# Patient Record
Sex: Female | Born: 1975 | Race: Black or African American | Hispanic: No | Marital: Single | Smoking: Current every day smoker
Health system: Southern US, Community
[De-identification: ages and names within clinical notes are randomized; demographics above are authoritative.]

## PROBLEM LIST (undated history)

## (undated) DIAGNOSIS — I10 Essential (primary) hypertension: Secondary | ICD-10-CM

## (undated) DIAGNOSIS — E669 Obesity, unspecified: Secondary | ICD-10-CM

## (undated) DIAGNOSIS — Z789 Other specified health status: Secondary | ICD-10-CM

## (undated) DIAGNOSIS — E119 Type 2 diabetes mellitus without complications: Secondary | ICD-10-CM

## (undated) HISTORY — PX: NO PAST SURGERIES: SHX2092

---

## 1998-04-15 ENCOUNTER — Ambulatory Visit (HOSPITAL_COMMUNITY): Admission: RE | Admit: 1998-04-15 | Discharge: 1998-04-15 | Payer: Self-pay | Admitting: Obstetrics

## 1998-04-15 ENCOUNTER — Other Ambulatory Visit: Admission: RE | Admit: 1998-04-15 | Discharge: 1998-04-15 | Payer: Self-pay | Admitting: Obstetrics

## 1998-06-18 ENCOUNTER — Ambulatory Visit (HOSPITAL_COMMUNITY): Admission: RE | Admit: 1998-06-18 | Discharge: 1998-06-18 | Payer: Self-pay | Admitting: Obstetrics

## 1998-08-28 ENCOUNTER — Inpatient Hospital Stay (HOSPITAL_COMMUNITY): Admission: AD | Admit: 1998-08-28 | Discharge: 1998-08-28 | Payer: Self-pay | Admitting: Obstetrics

## 1998-08-29 ENCOUNTER — Inpatient Hospital Stay (HOSPITAL_COMMUNITY): Admission: AD | Admit: 1998-08-29 | Discharge: 1998-08-31 | Payer: Self-pay | Admitting: Obstetrics

## 2000-01-15 ENCOUNTER — Emergency Department (HOSPITAL_COMMUNITY): Admission: EM | Admit: 2000-01-15 | Discharge: 2000-01-15 | Payer: Self-pay | Admitting: Emergency Medicine

## 2000-01-15 ENCOUNTER — Encounter: Payer: Self-pay | Admitting: Emergency Medicine

## 2001-01-19 ENCOUNTER — Inpatient Hospital Stay (HOSPITAL_COMMUNITY): Admission: AD | Admit: 2001-01-19 | Discharge: 2001-01-19 | Payer: Self-pay | Admitting: Obstetrics & Gynecology

## 2002-02-07 ENCOUNTER — Emergency Department (HOSPITAL_COMMUNITY): Admission: EM | Admit: 2002-02-07 | Discharge: 2002-02-07 | Payer: Self-pay | Admitting: Emergency Medicine

## 2002-03-15 ENCOUNTER — Emergency Department (HOSPITAL_COMMUNITY): Admission: EM | Admit: 2002-03-15 | Discharge: 2002-03-15 | Payer: Self-pay | Admitting: Emergency Medicine

## 2002-03-26 ENCOUNTER — Encounter: Payer: Self-pay | Admitting: Emergency Medicine

## 2002-03-26 ENCOUNTER — Emergency Department (HOSPITAL_COMMUNITY): Admission: EM | Admit: 2002-03-26 | Discharge: 2002-03-26 | Payer: Self-pay | Admitting: *Deleted

## 2002-06-07 ENCOUNTER — Emergency Department (HOSPITAL_COMMUNITY): Admission: EM | Admit: 2002-06-07 | Discharge: 2002-06-07 | Payer: Self-pay | Admitting: Emergency Medicine

## 2002-06-08 ENCOUNTER — Encounter: Payer: Self-pay | Admitting: Emergency Medicine

## 2002-06-08 ENCOUNTER — Emergency Department (HOSPITAL_COMMUNITY): Admission: EM | Admit: 2002-06-08 | Discharge: 2002-06-08 | Payer: Self-pay | Admitting: Emergency Medicine

## 2002-06-09 ENCOUNTER — Emergency Department (HOSPITAL_COMMUNITY): Admission: EM | Admit: 2002-06-09 | Discharge: 2002-06-09 | Payer: Self-pay | Admitting: Emergency Medicine

## 2002-07-14 ENCOUNTER — Emergency Department (HOSPITAL_COMMUNITY): Admission: EM | Admit: 2002-07-14 | Discharge: 2002-07-14 | Payer: Self-pay | Admitting: Emergency Medicine

## 2002-07-18 ENCOUNTER — Emergency Department (HOSPITAL_COMMUNITY): Admission: EM | Admit: 2002-07-18 | Discharge: 2002-07-18 | Payer: Self-pay | Admitting: *Deleted

## 2002-07-20 ENCOUNTER — Encounter: Payer: Self-pay | Admitting: Internal Medicine

## 2002-07-20 ENCOUNTER — Inpatient Hospital Stay (HOSPITAL_COMMUNITY): Admission: EM | Admit: 2002-07-20 | Discharge: 2002-07-22 | Payer: Self-pay | Admitting: Emergency Medicine

## 2002-08-15 ENCOUNTER — Emergency Department (HOSPITAL_COMMUNITY): Admission: EM | Admit: 2002-08-15 | Discharge: 2002-08-15 | Payer: Self-pay | Admitting: Emergency Medicine

## 2003-01-19 ENCOUNTER — Inpatient Hospital Stay (HOSPITAL_COMMUNITY): Admission: AD | Admit: 2003-01-19 | Discharge: 2003-01-19 | Payer: Self-pay | Admitting: *Deleted

## 2003-03-26 ENCOUNTER — Emergency Department (HOSPITAL_COMMUNITY): Admission: EM | Admit: 2003-03-26 | Discharge: 2003-03-26 | Payer: Self-pay | Admitting: Emergency Medicine

## 2003-06-03 ENCOUNTER — Emergency Department (HOSPITAL_COMMUNITY): Admission: EM | Admit: 2003-06-03 | Discharge: 2003-06-03 | Payer: Self-pay | Admitting: Emergency Medicine

## 2003-08-14 ENCOUNTER — Encounter: Payer: Self-pay | Admitting: Internal Medicine

## 2003-08-14 ENCOUNTER — Encounter: Admission: RE | Admit: 2003-08-14 | Discharge: 2003-08-14 | Payer: Self-pay

## 2003-12-18 ENCOUNTER — Inpatient Hospital Stay (HOSPITAL_COMMUNITY): Admission: AD | Admit: 2003-12-18 | Discharge: 2003-12-18 | Payer: Self-pay | Admitting: Obstetrics

## 2004-02-07 ENCOUNTER — Inpatient Hospital Stay (HOSPITAL_COMMUNITY): Admission: AD | Admit: 2004-02-07 | Discharge: 2004-02-07 | Payer: Self-pay | Admitting: Obstetrics

## 2004-02-26 ENCOUNTER — Inpatient Hospital Stay (HOSPITAL_COMMUNITY): Admission: AD | Admit: 2004-02-26 | Discharge: 2004-02-26 | Payer: Self-pay | Admitting: Obstetrics

## 2004-03-31 ENCOUNTER — Inpatient Hospital Stay (HOSPITAL_COMMUNITY): Admission: AD | Admit: 2004-03-31 | Discharge: 2004-04-02 | Payer: Self-pay | Admitting: Obstetrics

## 2005-02-08 ENCOUNTER — Ambulatory Visit: Payer: Self-pay | Admitting: Gastroenterology

## 2005-11-12 ENCOUNTER — Encounter (INDEPENDENT_AMBULATORY_CARE_PROVIDER_SITE_OTHER): Payer: Self-pay | Admitting: *Deleted

## 2005-11-12 ENCOUNTER — Inpatient Hospital Stay (HOSPITAL_COMMUNITY): Admission: AD | Admit: 2005-11-12 | Discharge: 2005-11-14 | Payer: Self-pay | Admitting: Obstetrics

## 2006-10-28 ENCOUNTER — Emergency Department (HOSPITAL_COMMUNITY): Admission: EM | Admit: 2006-10-28 | Discharge: 2006-10-28 | Payer: Self-pay | Admitting: Emergency Medicine

## 2007-04-09 ENCOUNTER — Inpatient Hospital Stay (HOSPITAL_COMMUNITY): Admission: AD | Admit: 2007-04-09 | Discharge: 2007-04-09 | Payer: Self-pay | Admitting: Obstetrics

## 2007-04-20 ENCOUNTER — Emergency Department (HOSPITAL_COMMUNITY): Admission: EM | Admit: 2007-04-20 | Discharge: 2007-04-20 | Payer: Self-pay | Admitting: Emergency Medicine

## 2008-09-25 ENCOUNTER — Emergency Department (HOSPITAL_COMMUNITY): Admission: EM | Admit: 2008-09-25 | Discharge: 2008-09-25 | Payer: Self-pay | Admitting: Family Medicine

## 2008-09-27 ENCOUNTER — Emergency Department (HOSPITAL_COMMUNITY): Admission: EM | Admit: 2008-09-27 | Discharge: 2008-09-27 | Payer: Self-pay | Admitting: Emergency Medicine

## 2008-10-18 ENCOUNTER — Ambulatory Visit: Payer: Self-pay | Admitting: Diagnostic Radiology

## 2008-10-18 ENCOUNTER — Emergency Department (HOSPITAL_BASED_OUTPATIENT_CLINIC_OR_DEPARTMENT_OTHER): Admission: EM | Admit: 2008-10-18 | Discharge: 2008-10-18 | Payer: Self-pay | Admitting: Emergency Medicine

## 2008-10-25 ENCOUNTER — Emergency Department (HOSPITAL_BASED_OUTPATIENT_CLINIC_OR_DEPARTMENT_OTHER): Admission: EM | Admit: 2008-10-25 | Discharge: 2008-10-25 | Payer: Self-pay | Admitting: Emergency Medicine

## 2008-10-31 ENCOUNTER — Emergency Department (HOSPITAL_BASED_OUTPATIENT_CLINIC_OR_DEPARTMENT_OTHER): Admission: EM | Admit: 2008-10-31 | Discharge: 2008-10-31 | Payer: Self-pay | Admitting: Emergency Medicine

## 2009-07-17 ENCOUNTER — Emergency Department (HOSPITAL_BASED_OUTPATIENT_CLINIC_OR_DEPARTMENT_OTHER): Admission: EM | Admit: 2009-07-17 | Discharge: 2009-07-17 | Payer: Self-pay | Admitting: Emergency Medicine

## 2011-04-01 NOTE — Discharge Summary (Signed)
NAMEMarland Kitchen  Shawna, Harris NO.:  [id number]   MEDICAL RECORD NO.:  [id number]                   PATIENT TYPE:  INP   LOCATION:  0482                                 FACILITY:  [facility]   PHYSICIAN:  Lazaro Arms, M.D.        DATE OF BIRTH:  Jan 13, 1976   DATE OF ADMISSION:  07/20/2002  DATE OF DISCHARGE:  07/22/2002                                 DISCHARGE SUMMARY   HISTORY OF PRESENT ILLNESS:  Ms. Shawna Harris is a 35 year old female who was  admitted for cellulitis refractor to oral therapy of her foot. The patient  has presented to the emergency room seven times since June 07, 2002, with  right foot pain which began approximately a week after having a pedicure.  She had pain in her fourth toe with some erythema of that foot. Of note  there was a razor used while cutting her nails, which may have caused some  breach of skin integrity. She has been seen in the emergency room and placed  on several courses of antibiotics which she took faithfully including Keflex  and Augmentin. She had minimal improvement the day prior to admission. She  had increased pain and swelling and the redness started extending up her  foot and she was admitted for cellulitis on July 20, 2002. She is a  gravida 2, para 2. She was noted to have an elevated blood sugar on her last  emergency room visit and was started on Glucophage at that point. Her LMP  was July 04, 2002 but she had an abortion in early August.   ALLERGIES:  No known drug allergies.   SOCIAL HISTORY:  She lives with her boyfriend and two children. She is  currently unemployed. She is a one pack a day smoker. Denies any alcohol or  drug use.   FAMILY HISTORY:  Has extensive diabetes mellitus on both sides.   REVIEW OF SYSTEMS:  She has recently had some weight gain but denies any  constitutional symptoms, fevers, or chills.   PHYSICAL EXAMINATION:  VITAL SIGNS: Afebrile. Pulse 74, respiratory rate  20,  blood pressure 121/70. A CBG was 81.  EXTREMITIES: Initial examination is remarkable for a 2 mm fluctuant mass  into the web space between her fourth and fifth toes. She did have some  erythema extending onto her foot and induration. The area was outlined with  pin initially.   An x-ray of the foot revealed some soft tissue swelling but no bony  abnormality. Initial white count was 10.5. Initial sed rate was 22 and her  CO2 was only slightly elevated at 1.8. BMP was within normal limits.   IMPRESSION:  Infected abscess with a surrounding cellulitis.   PROCEDURE:  The area was prepped and draped in usual sterile fashion and a  #21 gauge needle was introduced in sterile fashion and 3 cc of foul smelling  purulent exudate was expressed which was sent for a gram  stain and culture.  Then a #10 blade small superficial incision was made on the most superficial  epidermal layer, revealing healthy skin underneath. There was no more pus  expressed. The area was then dressed with sterile dressings.   HOSPITAL COURSE:  The patient was admitted and placed on Zosyn 3.375 grams  IV every six hours and given local wound care and blood sugars were  monitored. The patient was placed on a diabetic diet. The patient continued  to do well and remained afebrile with stable vital signs. The foot redness  completely resolved by the next day and her pain in her toe area reduced  down to the point where on the day of discharge, July 22, 2002, she only  had pain with palpation of the area. The wound still looked good. There was  just some superficial are of the incision that was still open. There was no  new exudate. There was no bleeding. And the area had reduced pain. Pulses  were good and sensation was intact.   LABORATORY DATA:  Hemoglobin A1C was 5.8 which was within normal limits. Her  culture came back positive for staph aureus resistant to methicillin.  However, it was extremely sensitive to  clindamycin.   DISCHARGE DIAGNOSES:  1. Toe abscess with methicillin-resistant Staphylococcus aureus, status post     incision and drainage.  2. Cellulitis, now resolved.  3. Elevated blood sugar, now resolved.   DISCHARGE MEDICATIONS:  1. Clindamycin 300 mg p.o. every six hours.  2. Tylenol as needed for pain.   FOLLOW UP:  The patient has an appointment tomorrow with Dr. Concepcion Harris at [address] at 2:30 p.m. for followup and wound check.   INSTRUCTIONS:  The patient was instructed to keep the area clean and dry and  was described extensively how to dress this in sterile fashion. She was  instructed not to swim and to keep the area dry. She will call immediately  if redness, swelling, or pain worsens.                                                Lazaro Arms, M.D.    AMC/MEDQ  D:  07/22/2002  T:  07/22/2002  Job:  [id number]   cc:   Dorma Russell A. Shawna Harris, M.D.

## 2011-08-16 LAB — CULTURE, ROUTINE-ABSCESS

## 2011-08-19 LAB — POCT CARDIAC MARKERS
Myoglobin, poc: 22 ng/mL (ref 12–200)
Troponin i, poc: <0.05 ng/mL (ref 0.00–0.09)

## 2012-08-28 ENCOUNTER — Emergency Department (HOSPITAL_BASED_OUTPATIENT_CLINIC_OR_DEPARTMENT_OTHER)
Admission: EM | Admit: 2012-08-28 | Discharge: 2012-08-28 | Disposition: A | Discharge status: Home / Self Care | Payer: Self-pay | Attending: Emergency Medicine | Admitting: Emergency Medicine

## 2012-08-28 ENCOUNTER — Encounter (HOSPITAL_BASED_OUTPATIENT_CLINIC_OR_DEPARTMENT_OTHER): Payer: Self-pay | Admitting: *Deleted

## 2012-08-28 DIAGNOSIS — K529 Noninfective gastroenteritis and colitis, unspecified: Secondary | ICD-10-CM

## 2012-08-28 DIAGNOSIS — K5289 Other specified noninfective gastroenteritis and colitis: Secondary | ICD-10-CM | POA: Insufficient documentation

## 2012-08-28 DIAGNOSIS — F17200 Nicotine dependence, unspecified, uncomplicated: Secondary | ICD-10-CM | POA: Insufficient documentation

## 2012-08-28 MED ORDER — ONDANSETRON 4 MG PO TBDP: 4.0000 mg | ORAL_TABLET | Freq: Three times a day (TID) | ORAL | Status: DC | PRN

## 2012-08-28 MED ORDER — ONDANSETRON 4 MG PO TBDP
4.0000 mg | ORAL_TABLET | Freq: Once | ORAL | Status: AC
  Administered 2012-08-28: 4 mg via ORAL
  Filled 2012-08-28: qty 1

## 2012-08-28 NOTE — ED Notes (Signed)
Tolerated po ice chips well.

## 2012-08-28 NOTE — ED Notes (Signed)
Ice chips given

## 2012-08-28 NOTE — ED Notes (Signed)
Diarrhea and vomiting since yesterday. States she was a party over the weekend with people recovering from a stomach virus.

## 2012-08-28 NOTE — ED Provider Notes (Signed)
History     CSN: [id number]  Arrival date & time 08/28/12  4098   First MD Initiated Contact with Patient 08/28/12 1853      Chief Complaint  Patient presents with  . Diarrhea    HPI Diarrhea and vomiting since yesterday. States she was a party over the weekend with people recovering from a stomach virus.  History reviewed. No pertinent past medical history.  History reviewed. No pertinent past surgical history.  No family history on file.  History  Substance Use Topics  . Smoking status: Current Every Day Smoker -- 0.5 packs/day    Types: Cigarettes  . Smokeless tobacco: Not on file  . Alcohol Use: Yes    OB History    Grav Para Term Preterm Abortions TAB SAB Ect Mult Living                  Review of Systems  All other systems reviewed and are negative.    Allergies  Review of patient's allergies indicates no known allergies.  Home Medications   Current Outpatient Rx  Name Route Sig Dispense Refill  . ONDANSETRON 4 MG PO TBDP Oral Take 1 tablet (4 mg total) by mouth every 8 (eight) hours as needed for nausea. 20 tablet 0    BP 130/89  Pulse 88  Temp 98.5 F (36.9 C) (Oral)  Resp 20  SpO2 100%  LMP 08/26/2012  Physical Exam  Nursing note and vitals reviewed. Constitutional: She is oriented to person, place, and time. She appears well-developed and well-nourished. No distress.  HENT:  Head: Normocephalic and atraumatic.  Eyes: Pupils are equal, round, and reactive to light.  Neck: Normal range of motion.  Cardiovascular: Normal rate and intact distal pulses.   Pulmonary/Chest: No respiratory distress.  Abdominal: Normal appearance. She exhibits no distension. There is no tenderness. There is no rebound and no guarding.  Musculoskeletal: Normal range of motion.  Neurological: She is alert and oriented to person, place, and time. No cranial nerve deficit.  Skin: Skin is warm and dry. No rash noted.  Psychiatric: She has a normal mood and affect.  Her behavior is normal.    ED Course  Procedures (including critical care time)  Medications  ondansetron (ZOFRAN ODT) 4 MG disintegrating tablet (not administered)  ondansetron (ZOFRAN-ODT) disintegrating tablet 4 mg (4 mg Oral Given 08/28/12 1906)    Labs Reviewed - No data to display No results found.   1. Gastroenteritis       MDM  After treatment in the ED the patient feels back to baseline and wants to go home.         Nelia Shi, MD 08/28/12 2016

## 2012-08-28 NOTE — ED Notes (Signed)
Pt declines iv start and lab draw. Pt states she wants to wait for the doctor to examine her first.

## 2012-08-28 NOTE — ED Notes (Signed)
MD at bedside. 

## 2013-03-02 ENCOUNTER — Encounter (HOSPITAL_BASED_OUTPATIENT_CLINIC_OR_DEPARTMENT_OTHER): Payer: Self-pay | Admitting: *Deleted

## 2013-03-02 ENCOUNTER — Emergency Department (HOSPITAL_BASED_OUTPATIENT_CLINIC_OR_DEPARTMENT_OTHER)
Admission: EM | Admit: 2013-03-02 | Discharge: 2013-03-02 | Disposition: A | Discharge status: Home / Self Care | Payer: BC Managed Care – PPO | Attending: Emergency Medicine | Admitting: Emergency Medicine

## 2013-03-02 DIAGNOSIS — J020 Streptococcal pharyngitis: Secondary | ICD-10-CM

## 2013-03-02 DIAGNOSIS — J309 Allergic rhinitis, unspecified: Secondary | ICD-10-CM | POA: Insufficient documentation

## 2013-03-02 DIAGNOSIS — IMO0001 Reserved for inherently not codable concepts without codable children: Secondary | ICD-10-CM | POA: Insufficient documentation

## 2013-03-02 DIAGNOSIS — F17200 Nicotine dependence, unspecified, uncomplicated: Secondary | ICD-10-CM | POA: Insufficient documentation

## 2013-03-02 DIAGNOSIS — J3489 Other specified disorders of nose and nasal sinuses: Secondary | ICD-10-CM | POA: Insufficient documentation

## 2013-03-02 DIAGNOSIS — J302 Other seasonal allergic rhinitis: Secondary | ICD-10-CM

## 2013-03-02 DIAGNOSIS — Z3202 Encounter for pregnancy test, result negative: Secondary | ICD-10-CM | POA: Insufficient documentation

## 2013-03-02 DIAGNOSIS — J029 Acute pharyngitis, unspecified: Secondary | ICD-10-CM | POA: Insufficient documentation

## 2013-03-02 LAB — PREGNANCY, URINE: Preg Test, Ur: NEGATIVE

## 2013-03-02 MED ORDER — PENICILLIN G BENZATHINE 1200000 UNIT/2ML IM SUSP
1.2000 10*6.[IU] | Freq: Once | INTRAMUSCULAR | Status: AC
  Administered 2013-03-02: 1.2 10*6.[IU] via INTRAMUSCULAR
  Filled 2013-03-02: qty 2

## 2013-03-02 MED ORDER — CETIRIZINE HCL 10 MG PO TABS: 10.0000 mg | ORAL_TABLET | Freq: Every day | ORAL | Status: DC

## 2013-03-02 MED ORDER — DIPHENHYDRAMINE HCL 25 MG PO CAPS
25.0000 mg | ORAL_CAPSULE | Freq: Once | ORAL | Status: AC
  Administered 2013-03-02: 25 mg via ORAL
  Filled 2013-03-02: qty 1

## 2013-03-02 NOTE — ED Provider Notes (Signed)
Medical screening examination/treatment/procedure(s) were performed by non-physician practitioner and as supervising physician I was immediately available for consultation/collaboration.  Ethelda Chick, MD 03/02/13 [phone]

## 2013-03-02 NOTE — ED Notes (Signed)
Pt states she has been sick for about a week with sinus issues, body aches, etc. Sore throat onset Thur. Congested

## 2013-03-02 NOTE — ED Provider Notes (Signed)
History     CSN: [id number]  Arrival date & time 03/02/13  1216   First MD Initiated Contact with Patient 03/02/13 1242      Chief Complaint  Patient presents with  . Sore Throat    (Consider location/radiation/quality/duration/timing/severity/associated sxs/prior treatment) HPI Comments: Patient is a 37 year old female who presents with a 10 day history of sore throat. Patient reports gradual onset and progressively worsening sharp, severe throat pain. The pain is constant and made worse with swallowing. The pain is localized to the patient's throat and equal on both sides. Nothing alleviates the pain. The patient has not tried anything for symptom relief. Patient reports associated cervical adenopathy, myalgias and congestion. Patient denies headache, visual changes, difficulty breathing, chest pain, SOB, abdominal pain, NVD.      History reviewed. No pertinent past medical history.  History reviewed. No pertinent past surgical history.  History reviewed. No pertinent family history.  History  Substance Use Topics  . Smoking status: Current Every Day Smoker -- 0.50 packs/day    Types: Cigarettes  . Smokeless tobacco: Not on file  . Alcohol Use: Yes    OB History   Grav Para Term Preterm Abortions TAB SAB Ect Mult Living                  Review of Systems  HENT: Positive for congestion and sore throat.   Musculoskeletal: Positive for myalgias.  All other systems reviewed and are negative.    Allergies  Review of patient's allergies indicates no known allergies.  Home Medications   Current Outpatient Rx  Name  Route  Sig  Dispense  Refill  . ondansetron (ZOFRAN ODT) 4 MG disintegrating tablet   Oral   Take 1 tablet (4 mg total) by mouth every 8 (eight) hours as needed for nausea.   20 tablet   0     BP 138/91  Pulse 99  Temp(Src) 98 F (36.7 C) (Oral)  Resp 18  Ht 5\' 4"  (1.626 m)  Wt 210 lb (95.255 kg)  BMI 36.03 kg/m2  SpO2 100%  LMP  02/23/2013  Physical Exam  Nursing note and vitals reviewed. Constitutional: She is oriented to person, place, and time. She appears well-developed and well-nourished. No distress.  HENT:  Head: Normocephalic and atraumatic.  Mouth/Throat: No oropharyngeal exudate.  Tonsillar edema and erythema. Posterior pharyngeal erythema. No exudate noted. Mild maxillary sinus tenderness to palpation.   Eyes: Conjunctivae and EOM are normal.  Neck: Normal range of motion.  Cervical adenopathy and tenderness to palpation.   Cardiovascular: Normal rate and regular rhythm.  Exam reveals no gallop and no friction rub.   No murmur heard. Pulmonary/Chest: Effort normal and breath sounds normal. She has no wheezes. She has no rales. She exhibits no tenderness.  Abdominal: Soft. She exhibits no distension. There is no tenderness. There is no rebound and no guarding.  Musculoskeletal: Normal range of motion.  Lymphadenopathy:    She has cervical adenopathy.  Neurological: She is alert and oriented to person, place, and time. Coordination normal.  Speech is goal-oriented. Moves limbs without ataxia.   Skin: Skin is warm and dry.  Psychiatric: She has a normal mood and affect. Her behavior is normal.    ED Course  Procedures (including critical care time)  Labs Reviewed  RAPID STREP SCREEN  PREGNANCY, URINE   No results found.   1. Strep pharyngitis   2. Seasonal allergies       MDM  1:00  PM Rapid strep pending.   2:25 PM Rapid strep negative. I will treat the patient based on clinical impression for strep throat. Patient likely experiencing seasonal allergies. I will prescribe Zyrtec for seasonal allergy symptoms. Patient afebrile with stable vitals at this time. Patient instructed to return with worsening or concerning symptoms.      Emilia Beck, PA-C 03/02/13 1432

## 2013-03-02 NOTE — ED Notes (Signed)
Patient is resting comfortably. 

## 2013-08-08 ENCOUNTER — Ambulatory Visit: Payer: Self-pay | Admitting: Obstetrics

## 2013-11-04 ENCOUNTER — Encounter (HOSPITAL_BASED_OUTPATIENT_CLINIC_OR_DEPARTMENT_OTHER): Payer: Self-pay | Admitting: Emergency Medicine

## 2013-11-04 ENCOUNTER — Emergency Department (HOSPITAL_BASED_OUTPATIENT_CLINIC_OR_DEPARTMENT_OTHER)
Admission: EM | Admit: 2013-11-04 | Discharge: 2013-11-04 | Disposition: A | Discharge status: Home / Self Care | Payer: Medicaid Other | Attending: Emergency Medicine | Admitting: Emergency Medicine

## 2013-11-04 DIAGNOSIS — R000 Tachycardia, unspecified: Secondary | ICD-10-CM | POA: Insufficient documentation

## 2013-11-04 DIAGNOSIS — Z79899 Other long term (current) drug therapy: Secondary | ICD-10-CM | POA: Insufficient documentation

## 2013-11-04 DIAGNOSIS — R55 Syncope and collapse: Secondary | ICD-10-CM

## 2013-11-04 DIAGNOSIS — F17200 Nicotine dependence, unspecified, uncomplicated: Secondary | ICD-10-CM | POA: Insufficient documentation

## 2013-11-04 DIAGNOSIS — Z3202 Encounter for pregnancy test, result negative: Secondary | ICD-10-CM | POA: Insufficient documentation

## 2013-11-04 DIAGNOSIS — E869 Volume depletion, unspecified: Secondary | ICD-10-CM

## 2013-11-04 LAB — URINALYSIS, ROUTINE W REFLEX MICROSCOPIC
Leukocytes, UA: NEGATIVE
Nitrite: NEGATIVE
Protein, ur: 30 mg/dL — AB
Specific Gravity, Urine: 1.026 (ref 1.005–1.030)
pH: 5.5 (ref 5.0–8.0)

## 2013-11-04 LAB — CBC
HCT: 37.5 % (ref 36.0–46.0)
Hemoglobin: 12.6 g/dL (ref 12.0–15.0)
MCH: 29.4 pg (ref 26.0–34.0)
RBC: 4.28 MIL/uL (ref 3.87–5.11)
RDW: 13.4 % (ref 11.5–15.5)

## 2013-11-04 LAB — URINE MICROSCOPIC-ADD ON

## 2013-11-04 MED ORDER — SODIUM CHLORIDE 0.9 % IV BOLUS (SEPSIS)
1000.0000 mL | Freq: Once | INTRAVENOUS | Status: AC
  Administered 2013-11-04: 1000 mL via INTRAVENOUS

## 2013-11-04 NOTE — ED Provider Notes (Signed)
CSN: [id number]     Arrival date & time 11/04/13  0308 History   First MD Initiated Contact with Patient 11/04/13 0326     Chief Complaint  Patient presents with  . Syncope    (Consider location/radiation/quality/duration/timing/severity/associated sxs/prior Treatment) HPI This is a 37 year old female who went to donate plasma yesterday. Due to loss of IV access they were unable to return the cellular blood components. This morning, just prior to arrival she got up to go to the bathroom, felt lightheaded and had a syncopal episode. She continues to feel lightheaded, worse with standing. She denies nausea or vomiting. She is not short of breath. She was noted to be tachycardic on arrival with positive orthostatic vital signs. She has had vaginal bleeding for the past month.  History reviewed. No pertinent past medical history. History reviewed. No pertinent past surgical history. History reviewed. No pertinent family history. History  Substance Use Topics  . Smoking status: Current Every Day Smoker -- 0.50 packs/day    Types: Cigarettes  . Smokeless tobacco: Not on file  . Alcohol Use: Yes     Comment: socially - 10/31/13   OB History   Grav Para Term Preterm Abortions TAB SAB Ect Mult Living                 Review of Systems  All other systems reviewed and are negative.    Allergies  Review of patient's allergies indicates no known allergies.  Home Medications   Current Outpatient Rx  Name  Route  Sig  Dispense  Refill  . cetirizine (ZYRTEC ALLERGY) 10 MG tablet   Oral   Take 1 tablet (10 mg total) by mouth daily.   30 tablet   1   . ondansetron (ZOFRAN ODT) 4 MG disintegrating tablet   Oral   Take 1 tablet (4 mg total) by mouth every 8 (eight) hours as needed for nausea.   20 tablet   0    BP 114/57  Pulse 130  Temp(Src) 98 F (36.7 C) (Oral)  Resp 18  Ht 5\' 5"  (1.651 m)  Wt 215 lb (97.523 kg)  BMI 35.78 kg/m2  SpO2 99%  LMP 11/04/2013  Physical  Exam General: Well-developed, well-nourished female in no acute distress; appearance consistent with age of record HENT: normocephalic; atraumatic Eyes: pupils equal, round and reactive to light; extraocular muscles intact Neck: supple Heart: regular rate and rhythm; tachycardic Lungs: clear to auscultation bilaterally Abdomen: soft; nondistended; nontender; no masses or hepatosplenomegaly; bowel sounds present Extremities: No deformity; full range of motion; pulses normal; no edema Neurologic: Awake, alert and oriented; motor function intact in all extremities and symmetric; no facial droop Skin: Warm and dry Psychiatric: Normal mood and affect    ED Course  Procedures (including critical care time)  MDM   Nursing notes and vitals signs, including pulse oximetry, reviewed.  Summary of this visit's results, reviewed by myself:  Labs:  Results for orders placed during the hospital encounter of 11/04/13 (from the past 24 hour(s))  CBC     Status: Abnormal   Collection Time    11/04/13  3:35 AM      Result Value Range   WBC 12.3 (*) 4.0 - 10.5 K/uL   RBC 4.28  3.87 - 5.11 MIL/uL   Hemoglobin 12.6  12.0 - 15.0 g/dL   HCT 78.2  95.6 - 21.3 %   MCV 87.6  78.0 - 100.0 fL   MCH 29.4  26.0 - 34.0 pg  MCHC 33.6  30.0 - 36.0 g/dL   RDW 16.1  09.6 - 04.5 %   Platelets 302  150 - 400 K/uL  URINALYSIS, ROUTINE W REFLEX MICROSCOPIC     Status: Abnormal   Collection Time    11/04/13  4:46 AM      Result Value Range   Color, Urine YELLOW  YELLOW   APPearance CLOUDY (*) CLEAR   Specific Gravity, Urine 1.026  1.005 - 1.030   pH 5.5  5.0 - 8.0   Glucose, UA 100 (*) NEGATIVE mg/dL   Hgb urine dipstick LARGE (*) NEGATIVE   Bilirubin Urine SMALL (*) NEGATIVE   Ketones, ur 15 (*) NEGATIVE mg/dL   Protein, ur 30 (*) NEGATIVE mg/dL   Urobilinogen, UA 0.2  0.0 - 1.0 mg/dL   Nitrite NEGATIVE  NEGATIVE   Leukocytes, UA NEGATIVE  NEGATIVE  PREGNANCY, URINE     Status: None   Collection  Time    11/04/13  4:46 AM      Result Value Range   Preg Test, Ur NEGATIVE  NEGATIVE  URINE MICROSCOPIC-ADD ON     Status: Abnormal   Collection Time    11/04/13  4:46 AM      Result Value Range   Squamous Epithelial / LPF FEW (*) RARE   WBC, UA 0-2  <3 WBC/hpf   RBC / HPF 7-10  <3 RBC/hpf   Bacteria, UA FEW (*) RARE   Casts HYALINE CASTS (*) NEGATIVE   Urine-Other MUCOUS PRESENT     5:18 AM Symptoms improved after IV fluid bolus. She has a PCP with whom she will follow up.  Hanley Seamen, MD 11/04/13 [phone]

## 2013-11-04 NOTE — ED Notes (Signed)
Pt states she was up to the bathroom 30 minutes ago, felt lightheaded, had syncopal episode - states she gave plasma today and they were unable to do the "return" process.

## 2014-01-08 ENCOUNTER — Encounter (HOSPITAL_COMMUNITY): Payer: Self-pay | Admitting: Emergency Medicine

## 2014-01-08 ENCOUNTER — Emergency Department (HOSPITAL_COMMUNITY)
Admission: EM | Admit: 2014-01-08 | Discharge: 2014-01-08 | Disposition: A | Discharge status: Home / Self Care | Payer: Medicaid Other | Source: Home / Self Care | Attending: Family Medicine | Admitting: Family Medicine

## 2014-01-08 DIAGNOSIS — R519 Headache, unspecified: Secondary | ICD-10-CM

## 2014-01-08 DIAGNOSIS — R51 Headache: Secondary | ICD-10-CM

## 2014-01-08 MED ORDER — KETOROLAC TROMETHAMINE 30 MG/ML IJ SOLN
30.0000 mg | Freq: Once | INTRAMUSCULAR | Status: AC
  Administered 2014-01-08: 30 mg via INTRAMUSCULAR

## 2014-01-08 MED ORDER — ONDANSETRON 4 MG PO TBDP
ORAL_TABLET | ORAL | Status: AC
Start: 2014-01-08 — End: 2014-01-08
  Filled 2014-01-08: qty 2

## 2014-01-08 MED ORDER — DIPHENHYDRAMINE HCL 50 MG/ML IJ SOLN
INTRAMUSCULAR | Status: AC
  Filled 2014-01-08: qty 1

## 2014-01-08 MED ORDER — DIPHENHYDRAMINE HCL 50 MG/ML IJ SOLN
25.0000 mg | Freq: Once | INTRAMUSCULAR | Status: AC
  Administered 2014-01-08: 25 mg via INTRAMUSCULAR

## 2014-01-08 MED ORDER — KETOROLAC TROMETHAMINE 30 MG/ML IJ SOLN
INTRAMUSCULAR | Status: AC
  Filled 2014-01-08: qty 1

## 2014-01-08 MED ORDER — ONDANSETRON 4 MG PO TBDP
8.0000 mg | ORAL_TABLET | Freq: Once | ORAL | Status: AC
  Administered 2014-01-08: 8 mg via ORAL

## 2014-01-08 NOTE — ED Provider Notes (Signed)
CSN: [id number]     Arrival date & time 01/08/14  1249 History   First MD Initiated Contact with Patient 01/08/14 1436     Chief Complaint  Patient presents with  . Headache     (Consider location/radiation/quality/duration/timing/severity/associated sxs/prior Treatment) HPI Comments: Patient reports headache began yesterday. Pain has been migratory since it began, occuring at different areas on her head at different times. Has waxed and waned with some relief with ibuprofen. Endorses some mild nausea and photophobia, but no phonophobia. Denis recent illness or injury. Denies hx of headaches. Denies fever. Denies changes in speech, vision or balance. No vomiting or diarrhea. No URI sx. No rash. No polyuria or polydipsia.  The history is provided by the patient.    History reviewed. No pertinent past medical history. History reviewed. No pertinent past surgical history. History reviewed. No pertinent family history. History  Substance Use Topics  . Smoking status: Current Every Day Smoker -- 0.50 packs/day    Types: Cigarettes  . Smokeless tobacco: Not on file  . Alcohol Use: Yes     Comment: socially - 10/31/13   OB History   Grav Para Term Preterm Abortions TAB SAB Ect Mult Living                 Review of Systems  All other systems reviewed and are negative.      Allergies  Review of patient's allergies indicates no known allergies.  Home Medications   Current Outpatient Rx  Name  Route  Sig  Dispense  Refill  . cetirizine (ZYRTEC ALLERGY) 10 MG tablet   Oral   Take 1 tablet (10 mg total) by mouth daily.   30 tablet   1   . ondansetron (ZOFRAN ODT) 4 MG disintegrating tablet   Oral   Take 1 tablet (4 mg total) by mouth every 8 (eight) hours as needed for nausea.   20 tablet   0    BP 107/74  Pulse 94  Temp(Src) 98 F (36.7 C) (Oral)  Resp 17  SpO2 99% Physical Exam  Nursing note and vitals reviewed. Constitutional: She is oriented to person, place,  and time. She appears well-developed and well-nourished. No distress.  HENT:  Head: Normocephalic and atraumatic.  Right Ear: Hearing, tympanic membrane, external ear and ear canal normal.  Left Ear: Hearing, tympanic membrane, external ear and ear canal normal.  Nose: Nose normal.  Mouth/Throat: Uvula is midline, oropharynx is clear and moist and mucous membranes are normal.  Eyes: Conjunctivae, EOM and lids are normal. Pupils are equal, round, and reactive to light. Right eye exhibits no nystagmus. Left eye exhibits no nystagmus.  Undilated fundoscopic exam grossly normal.   Neck: Trachea normal, normal range of motion, full passive range of motion without pain and phonation normal. Neck supple. No mass and no thyromegaly present.  Cardiovascular: Normal rate, regular rhythm and normal heart sounds.   Pulmonary/Chest: Effort normal and breath sounds normal.  Abdominal: Soft. Bowel sounds are normal. She exhibits no distension. There is no tenderness.  Musculoskeletal: Normal range of motion.  Neurological: She is alert and oriented to person, place, and time. She has normal strength. No cranial nerve deficit or sensory deficit. Coordination and gait normal. GCS eye subscore is 4. GCS verbal subscore is 5. GCS motor subscore is 6.  Skin: Skin is warm and dry.  Psychiatric: She has a normal mood and affect. Her behavior is normal.    ED Course  Procedures (including critical care  time) Labs Review Labs Reviewed - No data to display Imaging Review No results found.    MDM   Final diagnoses:  Headache  Non-specific headache: exam without worrisome neurological finding. Patient reports near resolution [facility] Use either tylenol or ibuprofen if headache reoccurs and zofran as directed for nausea. If symptoms become suddenly worse, severe or persistent, advised to seek re-evaluation at  nearest ER. If headache with associated changes in speech,  vision, balance, strength or sensation, seek re-evaluation at  nearest ER.      Jess BartersJennifer Lee [place] Heights[place], [place] 01/08/14 [phone]

## 2014-01-08 NOTE — ED Notes (Signed)
Patient c/o HA since this AM. NAD, c/o photophobia

## 2014-01-08 NOTE — Discharge Instructions (Signed)
Please make sure your get plenty of rest and stay well hydrated. Use either tylenol or ibuprofen if headache reoccurs and zofran as directed for nausea. If symptoms become suddenly worse, severe or persistent, please have yourself re-evaluated at your nearest ER. If you develop a headache with associated changes in speech, vision, balance, strength or sensation, seek re-evaluation at your nearest ER.

## 2014-01-10 NOTE — ED Provider Notes (Signed)
Medical screening examination/treatment/procedure(s) were performed by a resident physician or non-physician practitioner and as the supervising physician I was immediately available for consultation/collaboration.  Shade Kaley, MD    Toshi Ishii S Tiziana Cislo, MD 01/10/14 0739 

## 2014-02-26 ENCOUNTER — Encounter (HOSPITAL_COMMUNITY): Payer: Self-pay | Admitting: *Deleted

## 2014-02-26 ENCOUNTER — Inpatient Hospital Stay (HOSPITAL_COMMUNITY): Payer: Medicaid Other

## 2014-02-26 ENCOUNTER — Inpatient Hospital Stay (HOSPITAL_COMMUNITY)
Admission: AD | Admit: 2014-02-26 | Discharge: 2014-02-26 | Disposition: A | Discharge status: Home / Self Care | Payer: Medicaid Other | Source: Ambulatory Visit | Attending: Obstetrics & Gynecology | Admitting: Obstetrics & Gynecology

## 2014-02-26 DIAGNOSIS — N949 Unspecified condition associated with female genital organs and menstrual cycle: Secondary | ICD-10-CM | POA: Insufficient documentation

## 2014-02-26 DIAGNOSIS — R109 Unspecified abdominal pain: Secondary | ICD-10-CM | POA: Insufficient documentation

## 2014-02-26 DIAGNOSIS — IMO0001 Reserved for inherently not codable concepts without codable children: Secondary | ICD-10-CM

## 2014-02-26 DIAGNOSIS — N925 Other specified irregular menstruation: Secondary | ICD-10-CM

## 2014-02-26 DIAGNOSIS — N920 Excessive and frequent menstruation with regular cycle: Secondary | ICD-10-CM | POA: Insufficient documentation

## 2014-02-26 DIAGNOSIS — N938 Other specified abnormal uterine and vaginal bleeding: Secondary | ICD-10-CM

## 2014-02-26 DIAGNOSIS — F17200 Nicotine dependence, unspecified, uncomplicated: Secondary | ICD-10-CM

## 2014-02-26 DIAGNOSIS — E669 Obesity, unspecified: Secondary | ICD-10-CM

## 2014-02-26 DIAGNOSIS — R030 Elevated blood-pressure reading, without diagnosis of hypertension: Secondary | ICD-10-CM | POA: Insufficient documentation

## 2014-02-26 HISTORY — DX: Other specified health status: Z78.9

## 2014-02-26 LAB — CBC
HCT: 39 % (ref 36.0–46.0)
Hemoglobin: 13.3 g/dL (ref 12.0–15.0)
MCH: 29.5 pg (ref 26.0–34.0)
MCHC: 34.1 g/dL (ref 30.0–36.0)
MCV: 86.5 fL (ref 78.0–100.0)
Platelets: 328 10*3/uL (ref 150–400)
RBC: 4.51 MIL/uL (ref 3.87–5.11)
RDW: 13.4 % (ref 11.5–15.5)
WBC: 11.2 10*3/uL — AB (ref 4.0–10.5)

## 2014-02-26 LAB — WET PREP, GENITAL
Clue Cells Wet Prep HPF POC: NONE SEEN
TRICH WET PREP: NONE SEEN
YEAST WET PREP: NONE SEEN

## 2014-02-26 LAB — URINALYSIS, ROUTINE W REFLEX MICROSCOPIC
Bilirubin Urine: NEGATIVE
Glucose, UA: NEGATIVE mg/dL
KETONES UR: NEGATIVE mg/dL
LEUKOCYTES UA: NEGATIVE
NITRITE: NEGATIVE
PROTEIN: NEGATIVE mg/dL
Specific Gravity, Urine: 1.025 (ref 1.005–1.030)
Urobilinogen, UA: 2 mg/dL — ABNORMAL HIGH (ref 0.0–1.0)
pH: 6.5 (ref 5.0–8.0)

## 2014-02-26 LAB — URINE MICROSCOPIC-ADD ON

## 2014-02-26 MED ORDER — MEGESTROL ACETATE 20 MG PO TABS: 20.0000 mg | ORAL_TABLET | Freq: Every day | ORAL | Status: DC

## 2014-02-26 NOTE — MAU Provider Note (Signed)
History     CSN: [id number]  Arrival date and time: 02/26/14 1516   First Provider Initiated Contact with Patient 02/26/14 1543      Chief Complaint  Patient presents with  . Vaginal Bleeding   Vaginal Bleeding Associated symptoms include abdominal pain (pelvic pain x 2 days). Pertinent negatives include no constipation, diarrhea, dysuria, fever, flank pain, frequency, headaches, nausea, rash, sore throat, urgency or vomiting.    Shawna Harris is a [age] y.o. [id number] with a longstanding history of menorrhagia and prolonged bleeding presenting for a 4-5 week history of heavy bleeding. Shawna Harris reports that her cycles have been irregular for years lasting anywhere from 1 week to 3 weeks with 2 weeks to 3 months in between cycles. Her bleeding has always been heavy but in the last 2 days the patient reports needing to use tampons, 2 pads at a time changing them once every 2-4 hours. In the past she has had similar prolonged and bleeding episodes leading her to go to the ED. On those occasions, she reports being prescribed oral contraception for a week which stops her bleeding. However, she has not been willing to take oral contraception consistently due to her heavy smoking (10 cigarettes per day). Of note, the patient admits a family hx of fibroids. Shawna Harris also reports feeling pelvic pain yesterday and today. The pain is constant and dull rated 4/10 currently but has been as high as 8/10 (yesterday). She took 2 Advil yesterday for relief. However, her bleeding has persisted and she is worried she has fibroids. She does admit pain with intercourse and has previously been treated for BV. She denies any vaginal pain or discharge, urinary changes, fevers, N/V, changes in BM, diarrhea. Complete ROS is below. Shawna Harris also has not used any barriers during intercourse; however, she is not attempting pregnancy.  OB History   Grav Para Term Preterm Abortions TAB SAB Ect Mult Living   5 4 4  1 1     4       Past Medical History  Diagnosis Date  . Medical history non-contributory     Past Surgical History  Procedure Laterality Date  . No past surgeries      History reviewed. No pertinent family history.  History  Substance Use Topics  . Smoking status: Current Every Day Smoker -- 0.50 packs/day    Types: Cigarettes  . Smokeless tobacco: Not on file  . Alcohol Use: Yes     Comment: socially     Allergies: No Known Allergies  Prescriptions prior to admission  Medication Sig Dispense Refill  . cetirizine (ZYRTEC ALLERGY) 10 MG tablet Take 1 tablet (10 mg total) by mouth daily.  30 tablet  1  . ondansetron (ZOFRAN ODT) 4 MG disintegrating tablet Take 1 tablet (4 mg total) by mouth every 8 (eight) hours as needed for nausea.  20 tablet  0    Review of Systems  Constitutional: Negative for fever.  HENT: Negative for hearing loss and sore throat.   Eyes: Negative for blurred vision and double vision.  Respiratory: Negative for cough and shortness of breath.   Cardiovascular: Negative for chest pain and leg swelling.  Gastrointestinal: Positive for abdominal pain (pelvic pain x 2 days). Negative for nausea, vomiting, diarrhea, constipation and blood in stool.  Genitourinary: Positive for vaginal bleeding. Negative for dysuria, urgency, frequency and flank pain.  Musculoskeletal: Negative for myalgias.  Skin: Negative for rash.  Neurological: Positive for dizziness (x 2 days). Negative  for weakness and headaches.   Physical Exam   Blood pressure 150/89, pulse 103, temperature 98 F (36.7 C), temperature source Oral, resp. rate 20, last menstrual period 01/16/2014, SpO2 100.00%.  Physical Exam  Constitutional: She is oriented to person, place, and time. She appears well-developed and well-nourished. No distress.  HENT:  Head: Normocephalic and atraumatic.  Eyes: EOM are normal. Pupils are equal, round, and reactive to light. No scleral icterus.  Neck: Normal range  of motion. No tracheal deviation present.  Cardiovascular: Regular rhythm, normal heart sounds and intact distal pulses.  Exam reveals no gallop and no friction rub.   No murmur heard. Tachycardic  Respiratory: Effort normal and breath sounds normal. No stridor. No respiratory distress. She has no wheezes. She has no rales.  GI: Soft. Bowel sounds are normal. She exhibits no distension. There is no tenderness.  Genitourinary: There is no rash, tenderness or lesion on the right labia. There is no rash, tenderness or lesion on the left labia. Uterus is not deviated and not tender. Cervix exhibits no motion tenderness, no discharge and no friability. Right adnexum displays no tenderness. Left adnexum displays no tenderness. There is bleeding (bleeding noted externally) around the vagina. No erythema or tenderness around the vagina. No foreign body around the vagina. No signs of injury around the vagina. No vaginal discharge found.  Os is closed, bleeding noted but not actively hemorrhaging. Bimanual exam difficult to perform d/t obesity.  Musculoskeletal: Normal range of motion. She exhibits no edema and no tenderness.  Neurological: She is alert and oriented to person, place, and time.  Skin: Skin is warm and dry. She is not diaphoretic.  Psychiatric: She has a normal mood and affect.    MAU Course  Procedures  Results for orders placed during the hospital encounter of 02/26/14 (from the past 24 hour(s))  CBC     Status: Abnormal   Collection Time    02/26/14  3:55 PM      Result Value Ref Range   WBC 11.2 (*) 4.0 - 10.5 K/uL   RBC 4.51  3.87 - 5.11 MIL/uL   Hemoglobin 13.3  12.0 - 15.0 g/dL   HCT 09.839.0  11.936.0 - 14.746.0 %   MCV 86.5  78.0 - 100.0 fL   MCH 29.5  26.0 - 34.0 pg   MCHC 34.1  30.0 - 36.0 g/dL   RDW 82.913.4  56.211.5 - 13.015.5 %   Platelets 328  150 - 400 K/uL    No results found.  MDM Shawna Harris is a 38 y.o. [id number] with a 4-5 week history of heavy bleeding. Given her longstanding  history of menorrhagia, prolonged bleeding and family hx, fibroids are most likely diagnosis. Must rule out pregnancy, consider endometriosis, STI.  Assessment and Plan   A: Menorrhagia, prolonged vaginal bleeding  P: CBC, pelvic, UPT, transvag US, GC Chlamydia, wet mount  Update:  Wallis BambergMario Mani 02/26/2014, 4:06 PM   S: States PCP [facility] normal O: Bimanual: no CMT, no obvious abnormality but exam limited by body habitus Addendum   [place] Transvaginal Non-ob  02/26/2014   CLINICAL DATA:  Dysfunctional uterine bleeding.  EXAM: TRANSABDOMINAL AND TRANSVAGINAL ULTRASOUND OF PELVIS  TECHNIQUE: Both transabdominal and transvaginal ultrasound examinations of the pelvis were performed. Transabdominal technique was performed for global imaging of the pelvis including uterus, ovaries, adnexal regions, and pelvic cul-de-sac. It was necessary to proceed with endovaginal exam following the transabdominal exam to visualize the endometrium and adnexal structures.  COMPARISON:  None  FINDINGS: Uterus  Measurements: 10.5 x 4.8 x 6.2 cm. No fibroids or other mass visualized.  Endometrium  Thickness: 6 mm.  No focal abnormality visualized.  Right ovary  Measurements: 3.3 x 2.7 x 2.5 cm. Normal appearance/no adnexal mass.  Left ovary  Measurements: 2.6 x 1.8 x 1.6 cm. Normal appearance/no adnexal mass.  Other findings  No free fluid.  IMPRESSION: 1. The uterus exhibits no acute abnormalities. The endometrial stripe is not abnormally thickened. No abnormal endometrial fluid collections are demonstrated. 2. The ovaries are normal in appearance. No adnexal masses are demonstrated. 3. If bleeding remains unresponsive to hormonal or medical therapy, sonohysterogram should be considered for focal lesion work-up. (Ref: Radiological Reasoning: Algorithmic Workup of Abnormal Vaginal Bleeding with Endovaginal Sonography and Sonohysterography. AJR 2008; 119:J47-82)   Electronically Signed   By: David  [place]   On:  02/26/2014 17:10   US Pelvis Complete  02/26/2014   CLINICAL DATA:  Dysfunctional uterine bleeding.  EXAM: TRANSABDOMINAL AND TRANSVAGINAL ULTRASOUND OF PELVIS  TECHNIQUE: Both transabdominal and transvaginal ultrasound examinations of the pelvis were performed. Transabdominal technique was performed for global imaging of the pelvis including uterus, ovaries, adnexal regions, and pelvic cul-de-sac. It was necessary to proceed with endovaginal exam following the transabdominal exam to visualize the endometrium and adnexal structures.  COMPARISON:  None  FINDINGS: Uterus  Measurements: 10.5 x 4.8 x 6.2 cm. No fibroids or other mass visualized.  Endometrium  Thickness: 6 mm.  No focal abnormality visualized.  Right ovary  Measurements: 3.3 x 2.7 x 2.5 cm. Normal appearance/no adnexal mass.  Left ovary  Measurements: 2.6 x 1.8 x 1.6 cm. Normal appearance/no adnexal mass.  Other findings  No free fluid.  IMPRESSION: 1. The uterus exhibits no acute abnormalities. The endometrial stripe is not abnormally thickened. No abnormal endometrial fluid collections are demonstrated. 2. The ovaries are normal in appearance. No adnexal masses are demonstrated. 3. If bleeding remains unresponsive to hormonal or medical therapy, sonohysterogram should be considered for focal lesion work-up. (Ref: Radiological Reasoning: Algorithmic Workup of Abnormal Vaginal Bleeding with Endovaginal Sonography and Sonohysterography. AJR 2008; 956:O13-08)   Electronically Signed   By: David  [place]   On: 02/26/2014 17:10   Results for orders placed during the hospital encounter of 02/26/14 (from the past 24 hour(s))  CBC     Status: Abnormal   Collection Time    02/26/14  3:55 PM      Result Value Ref Range   WBC 11.2 (*) 4.0 - 10.5 K/uL   RBC 4.51  3.87 - 5.11 MIL/uL   Hemoglobin 13.3  12.0 - 15.0 g/dL   HCT 65.7  84.6 - 96.2 %   MCV 86.5  78.0 - 100.0 fL   MCH 29.5  26.0 - 34.0 pg   MCHC 34.1  30.0 - 36.0 g/dL   RDW 95.2  84.1 - 32.4  %   Platelets 328  150 - 400 K/uL  WET PREP, GENITAL     Status: Abnormal   Collection Time    02/26/14  4:05 PM      Result Value Ref Range   Yeast Wet Prep HPF POC NONE SEEN  NONE SEEN   Trich, Wet Prep NONE SEEN  NONE SEEN   Clue Cells Wet Prep HPF POC NONE SEEN  NONE SEEN   WBC, Wet Prep HPF POC FEW (*) NONE SEEN  UPT negative  1. DUB (dysfunctional uterine bleeding)   2. Elevated BP  3. Obesity   4. Smoker    Evaluation and management procedures were performed by PA-S under my supervision/collaboration. Chart reviewed, patient examined by me and I agree with management and plan.  Discussed normal findings with Dr. Despina Hidden.    Medication List    STOP taking these medications       ibuprofen 200 MG tablet  Commonly known as:  ADVIL,MOTRIN      TAKE these medications       megestrol 20 MG tablet  Commonly known as:  MEGACE  Take 1 tablet (20 mg total) by mouth daily.       Follow-up Information   Follow up with [facility]. (Someone from Clinic will call you with appt.)    Contact information:   [address] [place] [address] [phone]     Danae Orleans, CNM 02/26/2014 6:03 PM

## 2014-02-26 NOTE — Discharge Instructions (Signed)
Abnormal Uterine Bleeding Abnormal uterine bleeding can affect women at various stages in life, including teenagers, women in their reproductive years, pregnant women, and women who have reached menopause. Several kinds of uterine bleeding are considered abnormal, including:  Bleeding or spotting between periods.   Bleeding after sexual intercourse.   Bleeding that is heavier or more than normal.   Periods that last longer than usual.  Bleeding after menopause.  Many cases of abnormal uterine bleeding are minor and simple to treat, while others are more serious. Any type of abnormal bleeding should be evaluated by your health care provider. Treatment will depend on the cause of the bleeding. HOME CARE INSTRUCTIONS Monitor your condition for any changes. The following actions may help to alleviate any discomfort you are experiencing:  Avoid the use of tampons and douches as directed by your health care provider.  Change your pads frequently. You should get regular pelvic exams and Pap tests. Keep all follow-up appointments for diagnostic tests as directed by your health care provider.  SEEK MEDICAL CARE IF:   Your bleeding lasts more than 1 week.   You feel dizzy at times.  SEEK IMMEDIATE MEDICAL CARE IF:   You pass out.   You are changing pads every 15 to 30 minutes.   You have abdominal pain.  You have a fever.   You become sweaty or weak.   You are passing large blood clots from the vagina.   You start to feel nauseous and vomit. MAKE SURE YOU:   Understand these instructions.  Will watch your condition.  Will get help right away if you are not doing well or get worse. Document Released: 10/31/2005 Document Revised: 07/03/2013 Document Reviewed: 05/30/2013 [facility] Patient Information 2014 [employer], [place].  Keep bleeding diary

## 2014-02-26 NOTE — MAU Note (Signed)
Bleeding started 4-5 wks ago;  Has been heavy (changing every 3-4 hrs) yesterday got worse, today was soaking through double pads and tampons.  Passing clots.  Hx of prolonged cycles

## 2014-02-27 LAB — GC/CHLAMYDIA PROBE AMP
CT PROBE, AMP APTIMA: NEGATIVE
GC PROBE AMP APTIMA: NEGATIVE

## 2014-03-11 ENCOUNTER — Encounter: Payer: Self-pay | Admitting: Medical

## 2014-05-09 ENCOUNTER — Encounter: Payer: Medicaid Other | Admitting: Medical

## 2014-06-06 ENCOUNTER — Encounter: Payer: Self-pay | Admitting: Obstetrics & Gynecology

## 2014-06-06 ENCOUNTER — Encounter: Payer: Medicaid Other | Admitting: Obstetrics & Gynecology

## 2014-08-26 ENCOUNTER — Emergency Department (HOSPITAL_BASED_OUTPATIENT_CLINIC_OR_DEPARTMENT_OTHER): Payer: No Typology Code available for payment source

## 2014-08-26 ENCOUNTER — Encounter (HOSPITAL_BASED_OUTPATIENT_CLINIC_OR_DEPARTMENT_OTHER): Payer: Self-pay | Admitting: Emergency Medicine

## 2014-08-26 ENCOUNTER — Emergency Department (HOSPITAL_BASED_OUTPATIENT_CLINIC_OR_DEPARTMENT_OTHER)
Admission: EM | Admit: 2014-08-26 | Discharge: 2014-08-26 | Disposition: A | Discharge status: Home / Self Care | Payer: No Typology Code available for payment source | Attending: Emergency Medicine | Admitting: Emergency Medicine

## 2014-08-26 DIAGNOSIS — S4992XA Unspecified injury of left shoulder and upper arm, initial encounter: Secondary | ICD-10-CM | POA: Insufficient documentation

## 2014-08-26 DIAGNOSIS — Z720 Tobacco use: Secondary | ICD-10-CM | POA: Diagnosis not present

## 2014-08-26 DIAGNOSIS — M25512 Pain in left shoulder: Secondary | ICD-10-CM

## 2014-08-26 DIAGNOSIS — Y9389 Activity, other specified: Secondary | ICD-10-CM | POA: Diagnosis not present

## 2014-08-26 DIAGNOSIS — S299XXA Unspecified injury of thorax, initial encounter: Secondary | ICD-10-CM | POA: Diagnosis present

## 2014-08-26 DIAGNOSIS — M546 Pain in thoracic spine: Secondary | ICD-10-CM

## 2014-08-26 DIAGNOSIS — Y92524 Gas station as the place of occurrence of the external cause: Secondary | ICD-10-CM | POA: Diagnosis not present

## 2014-08-26 MED ORDER — IBUPROFEN 800 MG PO TABS: 800.0000 mg | ORAL_TABLET | Freq: Three times a day (TID) | ORAL | Status: DC

## 2014-08-26 MED ORDER — IBUPROFEN 800 MG PO TABS
800.0000 mg | ORAL_TABLET | Freq: Once | ORAL | Status: AC
  Administered 2014-08-26: 800 mg via ORAL
  Filled 2014-08-26: qty 1
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
## 2014-08-26 NOTE — Discharge Instructions (Signed)

## 2014-09-15 ENCOUNTER — Encounter (HOSPITAL_BASED_OUTPATIENT_CLINIC_OR_DEPARTMENT_OTHER): Payer: Self-pay | Admitting: Emergency Medicine

## 2015-03-15 ENCOUNTER — Inpatient Hospital Stay (HOSPITAL_COMMUNITY): Payer: Medicaid Other

## 2015-03-15 ENCOUNTER — Inpatient Hospital Stay (HOSPITAL_COMMUNITY)
Admission: AD | Admit: 2015-03-15 | Discharge: 2015-03-15 | Disposition: A | Discharge status: Home / Self Care | Payer: Medicaid Other | Source: Ambulatory Visit | Attending: Obstetrics and Gynecology | Admitting: Obstetrics and Gynecology

## 2015-03-15 ENCOUNTER — Encounter (HOSPITAL_COMMUNITY): Payer: Self-pay

## 2015-03-15 DIAGNOSIS — O0930 Supervision of pregnancy with insufficient antenatal care, unspecified trimester: Secondary | ICD-10-CM | POA: Insufficient documentation

## 2015-03-15 DIAGNOSIS — Z3A15 15 weeks gestation of pregnancy: Secondary | ICD-10-CM | POA: Insufficient documentation

## 2015-03-15 DIAGNOSIS — R102 Pelvic and perineal pain: Secondary | ICD-10-CM | POA: Diagnosis present

## 2015-03-15 DIAGNOSIS — Z3A16 16 weeks gestation of pregnancy: Secondary | ICD-10-CM

## 2015-03-15 DIAGNOSIS — F17210 Nicotine dependence, cigarettes, uncomplicated: Secondary | ICD-10-CM | POA: Diagnosis not present

## 2015-03-15 DIAGNOSIS — O10012 Pre-existing essential hypertension complicating pregnancy, second trimester: Secondary | ICD-10-CM | POA: Diagnosis not present

## 2015-03-15 DIAGNOSIS — O26899 Other specified pregnancy related conditions, unspecified trimester: Secondary | ICD-10-CM | POA: Insufficient documentation

## 2015-03-15 DIAGNOSIS — I10 Essential (primary) hypertension: Secondary | ICD-10-CM | POA: Diagnosis not present

## 2015-03-15 DIAGNOSIS — R109 Unspecified abdominal pain: Secondary | ICD-10-CM

## 2015-03-15 DIAGNOSIS — O09522 Supervision of elderly multigravida, second trimester: Secondary | ICD-10-CM | POA: Insufficient documentation

## 2015-03-15 DIAGNOSIS — O10912 Unspecified pre-existing hypertension complicating pregnancy, second trimester: Secondary | ICD-10-CM

## 2015-03-15 LAB — CBC
HCT: 35.7 % — ABNORMAL LOW (ref 36.0–46.0)
Hemoglobin: 12.2 g/dL (ref 12.0–15.0)
MCH: 29.3 pg (ref 26.0–34.0)
MCHC: 34.2 g/dL (ref 30.0–36.0)
MCV: 85.8 fL (ref 78.0–100.0)
Platelets: 316 K/uL (ref 150–400)
RBC: 4.16 MIL/uL (ref 3.87–5.11)
RDW: 13.8 % (ref 11.5–15.5)
WBC: 17 K/uL — ABNORMAL HIGH (ref 4.0–10.5)

## 2015-03-15 LAB — URINALYSIS, ROUTINE W REFLEX MICROSCOPIC
Bilirubin Urine: NEGATIVE
Glucose, UA: 500 mg/dL — AB
Hgb urine dipstick: NEGATIVE
Ketones, ur: NEGATIVE mg/dL
Leukocytes, UA: NEGATIVE
Nitrite: NEGATIVE
Protein, ur: NEGATIVE mg/dL
Specific Gravity, Urine: >1.03 — ABNORMAL HIGH (ref 1.005–1.030)
Urobilinogen, UA: 1 mg/dL (ref 0.0–1.0)
pH: 6 (ref 5.0–8.0)

## 2015-03-15 LAB — ABO/RH: ABO/RH(D): O POS

## 2015-03-15 LAB — WET PREP, GENITAL
Trich, Wet Prep: NONE SEEN
Yeast Wet Prep HPF POC: NONE SEEN

## 2015-03-15 LAB — HCG, QUANTITATIVE, PREGNANCY: HCG, BETA CHAIN, QUANT, S: 17422 m[IU]/mL — AB (ref <5)

## 2015-03-15 LAB — POCT PREGNANCY, URINE: Preg Test, Ur: POSITIVE — AB

## 2015-03-15 LAB — GLUCOSE, CAPILLARY: Glucose-Capillary: 93 mg/dL (ref 70–99)

## 2015-03-15 MED ORDER — ASPIRIN 81 MG PO CHEW: 81.0000 mg | CHEWABLE_TABLET | Freq: Every day | ORAL | Status: DC

## 2015-03-15 MED ORDER — PRENATAL VITAMINS PLUS 27-1 MG PO TABS: 1.0000 | ORAL_TABLET | Freq: Every day | ORAL | Status: DC

## 2015-03-15 MED ORDER — LABETALOL HCL 200 MG PO TABS: 200.0000 mg | ORAL_TABLET | Freq: Two times a day (BID) | ORAL | Status: DC

## 2015-03-15 NOTE — MAU Note (Signed)
Pt presents complaining of lower abdominal pain that started two months ago with pelvic pain that radiates to her back. States she thinks she may have BV. This is an unplanned pregnancy.

## 2015-03-15 NOTE — Discharge Instructions (Signed)
Drink at least 8 8-oz glasses of water every day. Take Tylenol 325 mg 2 tablets by mouth every 4 hours if needed for pain.  No smoking, no drugs, no alcohol.  Take a prenatal vitamin one by mouth every day.   Begin prenatal care as soon as possible. Verification of pregnancy given - Medicaid not active for today's visit. Advised activity in the pool to assist with aches and pains of pregnancy. Get your medications at the pharmacy and take them as directed.

## 2015-03-15 NOTE — MAU Provider Note (Signed)
History     CSN: [id number]  Arrival date and time: 03/15/15 1359   First Provider Initiated Contact with Patient 03/15/15 1442      Chief Complaint  Patient presents with  . Abdominal Pain  . Pelvic Pain   HPI Shawna Harris 39 y.o.  Came to [facility]U today with pelvic pain almost daily for the past 2-3 months.  Pain radiates to her back.  Has been taking Advil for the pain. Has been busy in nursing school and has not taken the time to go to the doctor.  Has history of irregular cycles with heavy menstrual bleeding when it occurs.  Youngest child is 6 years old.  Thought she was going through perimenopause.  Has not been using contraception.  Is shocked today to find out her pregnancy test is positive.  LMP 3 weeks ago.  Has a history of being very tired.  Has had some nausea but no vomiting.  Is using the nicotine patches and stopped smoking 2 weeks ago.   OB History    Gravida Para Term Preterm AB TAB SAB Ectopic Multiple Living   Past Medical History  Diagnosis Date  . Medical history non-contributory     Past Surgical History  Procedure Laterality Date  . No past surgeries      History reviewed. No pertinent family history.  History  Substance Use Topics  . Smoking status: Current Every Day Smoker -- 0.50 packs/day    Types: Cigarettes  . Smokeless tobacco: Not on file  . Alcohol Use: Yes     Comment: socially     Allergies: No Known Allergies  Prescriptions prior to admission  Medication Sig Dispense Refill Last Dose  . ibuprofen (ADVIL,MOTRIN) 800 MG tablet Take 1 tablet (800 mg total) by mouth 3 (three) times daily. 21 tablet 0 03/15/2015 at Unknown time  . nicotine (NICODERM CQ - DOSED IN MG/24 HOURS) 21 mg/24hr patch Place 21 mg onto the skin daily.   03/15/2015 at Unknown time  . megestrol (MEGACE) 20 MG tablet Take 1 tablet (20 mg total) by mouth daily. (Patient not taking: Reported on 03/15/2015) 40 tablet 0 Completed Course at Unknown time     Review of Systems  Constitutional: Negative for fever.  Gastrointestinal: Positive for nausea and abdominal pain. Negative for vomiting, diarrhea and constipation.  Genitourinary:       Pelvic pain No vaginal discharge. No vaginal bleeding. No dysuria. No leaking.   Physical Exam   Blood pressure 148/103, pulse 126, temperature 98 F (36.7 C), temperature source Oral, resp. rate 18, last menstrual period 02/22/2015.  Physical Exam  Nursing note and vitals reviewed. Constitutional: She is oriented to person, place, and time. She appears well-developed and well-nourished.  obesity  HENT:  Head: Normocephalic.  Eyes: EOM are normal.  Neck: Neck supple.  GI: Soft. There is tenderness. There is no rebound and no guarding.  Mild tenderness with palpation.    Genitourinary:  Speculum exam: Vagina - Small amount of creamy discharge, no odor Cervix - No contact bleeding Bimanual exam: Cervix closed Uterus mild tenderness noted with speculum exam, unable to size uterus due to habitus Adnexa non tender, exam limited due to habitus GC/Chlam, wet prep done Chaperone present for exam.  Musculoskeletal: Normal range of motion.  Neurological: She is alert and oriented to person, place, and time.  Skin: Skin is warm and dry.  Psychiatric:  She has a normal mood and affect.    MAU Course  Procedures Results for orders placed or performed during the hospital encounter of 03/15/15 (from the past 24 hour(s))  Urinalysis, Routine w reflex microscopic     Status: Abnormal   Collection Time: 03/15/15  2:15 PM  Result Value Ref Range   Color, Urine YELLOW YELLOW   APPearance CLEAR CLEAR   Specific Gravity, Urine >1.030 (H) 1.005 - 1.030   pH 6.0 5.0 - 8.0   Glucose, UA 500 (A) NEGATIVE mg/dL   Hgb urine dipstick NEGATIVE NEGATIVE   Bilirubin Urine NEGATIVE NEGATIVE   Ketones, ur NEGATIVE NEGATIVE mg/dL   Protein, ur NEGATIVE NEGATIVE mg/dL   Urobilinogen, UA 1.0 0.0 - 1.0 mg/dL    Nitrite NEGATIVE NEGATIVE   Leukocytes, UA NEGATIVE NEGATIVE  Pregnancy, urine POC     Status: Abnormal   Collection Time: 03/15/15  2:23 PM  Result Value Ref Range   Preg Test, Ur POSITIVE (A) NEGATIVE  CBC     Status: Abnormal   Collection Time: 03/15/15  2:55 PM  Result Value Ref Range   WBC 17.0 (H) 4.0 - 10.5 K/uL   RBC 4.16 3.87 - 5.11 MIL/uL   Hemoglobin 12.2 12.0 - 15.0 g/dL   HCT 16.1 (L) 09.6 - 04.5 %   MCV 85.8 78.0 - 100.0 fL   MCH 29.3 26.0 - 34.0 pg   MCHC 34.2 30.0 - 36.0 g/dL   RDW 40.9 81.1 - 91.4 %   Platelets 316 150 - 400 K/uL  hCG, quantitative, pregnancy     Status: Abnormal   Collection Time: 03/15/15  2:55 PM  Result Value Ref Range   hCG, Beta Chain, Quant, S 17422 (H) <5 mIU/mL  ABO/Rh     Status: None (Preliminary result)   Collection Time: 03/15/15  2:55 PM  Result Value Ref Range   ABO/RH(D) O POS   Wet prep, genital     Status: Abnormal   Collection Time: 03/15/15  3:05 PM  Result Value Ref Range   Yeast Wet Prep HPF POC NONE SEEN NONE SEEN   Trich, Wet Prep NONE SEEN NONE SEEN   Clue Cells Wet Prep HPF POC FEW (A) NONE SEEN   WBC, Wet Prep HPF POC FEW (A) NONE SEEN  Glucose, capillary     Status: None   Collection Time: 03/15/15  4:53 PM  Result Value Ref Range   Glucose-Capillary 93 70 - 99 mg/dL    MDM BP in 05-8294 was 150/89.  Pressures today have been 148/103, 142/86, 150/91, 145/84.  Likely client has undiagnosed chronic hypertension.  Client insisted she was only upset over the news about being pregnant and does not have hypertension.  Recommended she take one baby aspirin daily but client says she does not have hypertension, only stress today.  Client reports she drinks a lot of sweet tea.  Advised to stop sweet tea as she is spilling sugar in her urine.  Advised she will likely have a one hour glucola at her first prenatal visit.  Assessment and Plan  IUP at 15 weeks  AMA Undiagnosed chronic hypertension  Plan Drink at least 8  8-oz glasses of water every day. Take Tylenol 325 mg 2 tablets by mouth every 4 hours if needed for pain.  No smoking, no drugs, no alcohol.  Take a prenatal vitamin one by mouth every day.   Begin prenatal care as soon as possible. Verification of pregnancy given - Medicaid  not active for today's visit. Advised activity in the pool to assist with aches and pains of pregnancy. Consult with Dr. Jolayne Pantheronstant with plan of care Recommend starting baby ASA one daily and labetalol 200 mg PO BID. Expect the clinic to call and schedule an appointment to begin prenatal care.    Larosa Rhines 03/15/2015, 2:51 PM

## 2015-03-16 LAB — GC/CHLAMYDIA PROBE AMP (~~LOC~~) NOT AT ARMC
Chlamydia: NEGATIVE
NEISSERIA GONORRHEA: NEGATIVE

## 2015-03-16 LAB — HIV ANTIBODY (ROUTINE TESTING W REFLEX): HIV SCREEN 4TH GENERATION: NONREACTIVE

## 2015-03-16 LAB — RPR: RPR Ser Ql: NONREACTIVE

## 2015-04-02 ENCOUNTER — Encounter: Payer: Self-pay | Admitting: Obstetrics & Gynecology

## 2015-11-18 ENCOUNTER — Emergency Department (HOSPITAL_BASED_OUTPATIENT_CLINIC_OR_DEPARTMENT_OTHER)
Admission: EM | Admit: 2015-11-18 | Discharge: 2015-11-18 | Disposition: A | Discharge status: Home / Self Care | Payer: Medicaid Other | Attending: Emergency Medicine | Admitting: Emergency Medicine

## 2015-11-18 ENCOUNTER — Encounter (HOSPITAL_BASED_OUTPATIENT_CLINIC_OR_DEPARTMENT_OTHER): Payer: Self-pay | Admitting: Emergency Medicine

## 2015-11-18 DIAGNOSIS — Y9389 Activity, other specified: Secondary | ICD-10-CM | POA: Insufficient documentation

## 2015-11-18 DIAGNOSIS — W272XXA Contact with scissors, initial encounter: Secondary | ICD-10-CM | POA: Diagnosis not present

## 2015-11-18 DIAGNOSIS — Z79899 Other long term (current) drug therapy: Secondary | ICD-10-CM | POA: Insufficient documentation

## 2015-11-18 DIAGNOSIS — S60221A Contusion of right hand, initial encounter: Secondary | ICD-10-CM | POA: Diagnosis not present

## 2015-11-18 DIAGNOSIS — Z7982 Long term (current) use of aspirin: Secondary | ICD-10-CM | POA: Diagnosis not present

## 2015-11-18 DIAGNOSIS — Z23 Encounter for immunization: Secondary | ICD-10-CM | POA: Insufficient documentation

## 2015-11-18 DIAGNOSIS — F17210 Nicotine dependence, cigarettes, uncomplicated: Secondary | ICD-10-CM | POA: Diagnosis not present

## 2015-11-18 DIAGNOSIS — S61432A Puncture wound without foreign body of left hand, initial encounter: Secondary | ICD-10-CM | POA: Insufficient documentation

## 2015-11-18 DIAGNOSIS — Y998 Other external cause status: Secondary | ICD-10-CM | POA: Insufficient documentation

## 2015-11-18 DIAGNOSIS — S61412A Laceration without foreign body of left hand, initial encounter: Secondary | ICD-10-CM | POA: Diagnosis present

## 2015-11-18 DIAGNOSIS — Y9289 Other specified places as the place of occurrence of the external cause: Secondary | ICD-10-CM | POA: Diagnosis not present

## 2015-11-18 MED ORDER — BACITRACIN ZINC 500 UNIT/GM EX OINT
TOPICAL_OINTMENT | Freq: Two times a day (BID) | CUTANEOUS | Status: DC
  Administered 2015-11-18: 1 via TOPICAL

## 2015-11-18 MED ORDER — SULFAMETHOXAZOLE-TRIMETHOPRIM 800-160 MG PO TABS: 1.0000 | ORAL_TABLET | Freq: Two times a day (BID) | ORAL | Status: AC

## 2015-11-18 MED ORDER — FLUCONAZOLE 200 MG PO TABS: 200.0000 mg | ORAL_TABLET | Freq: Every day | ORAL | Status: AC

## 2015-11-18 MED ORDER — TETANUS-DIPHTH-ACELL PERTUSSIS 5-2.5-18.5 LF-MCG/0.5 IM SUSP
0.5000 mL | Freq: Once | INTRAMUSCULAR | Status: AC
  Administered 2015-11-18: 0.5 mL via INTRAMUSCULAR
  Filled 2015-11-18: qty 0.5

## 2015-11-18 MED FILL — FLUCONAZOLE 200 MG TABLET: 200 | 1 days supply | Qty: 1 | Fill #0

## 2015-11-18 MED FILL — SULFAMETHOXAZOLE/TMP DS TAB: 800-160 | 7 days supply | Qty: 14 | Fill #0

## 2015-11-18 NOTE — Discharge Instructions (Signed)
Puncture Wound A puncture wound is an injury that is caused by a sharp, thin object that goes through (penetrates) your skin. Usually, a puncture wound does not leave a large opening in your skin, so it may not bleed a lot. However, when you get a puncture wound, dirt or other materials (foreign bodies) can be forced into your wound and break off inside. This increases the chance of infection, such as tetanus. CAUSES Puncture wounds are caused by any sharp, thin object that goes through your skin, such as:  Animal teeth, as with an animal bite.  Sharp, pointed objects, such as nails, splinters of glass, fishhooks, and needles. SYMPTOMS Symptoms of a puncture wound include:  Pain.  Bleeding.  Swelling.  Bruising.  Fluid leaking from the wound.  Numbness, tingling, or loss of function. DIAGNOSIS This condition is diagnosed with a medical history and physical exam. Your wound will be checked to see if it contains any foreign bodies. You may also have X-rays or other imaging tests. TREATMENT Treatment for a puncture wound depends on how serious the wound is. It also depends on whether the wound contains any foreign bodies. Treatment for all types of puncture wounds usually starts with:  Controlling the bleeding.  Washing out the wound with a germ-free (sterile) salt-water solution.  Checking the wound for foreign bodies. Treatment may also include:  Having the wound opened surgically to remove a foreign object.  Closing the wound with stitches (sutures) if it continues to bleed.  Covering the wound with antibiotic ointments and a bandage (dressing).  Receiving a tetanus shot.  Receiving a rabies vaccine. HOME CARE INSTRUCTIONS Medicines  Take or apply over-the-counter and prescription medicines only as told by your health care provider.  If you were prescribed an antibiotic, take or apply it as told by your health care provider. Do not stop using the antibiotic even if  your condition improves. Wound Care  There are many ways to close and cover a wound. For example, a wound can be covered with sutures, skin glue, or adhesive strips. Follow instructions from your health care provider about:  How to take care of your wound.  When and how you should change your dressing.  When you should remove your dressing.  Removing whatever was used to close your wound.  Keep the dressing dry as told by your health care provider. Do not take baths, swim, use a hot tub, or do anything that would put your wound underwater until your health care provider approves.  Clean the wound as told by your health care provider.  Do not scratch or pick at the wound.  Check your wound every day for signs of infection. Watch for:  Redness, swelling, or pain.  Fluid, blood, or pus. General Instructions  Raise (elevate) the injured area above the level of your heart while you are sitting or lying down.  If your puncture wound is in your foot, ask your health care provider if you need to avoid putting weight on your foot and for how long.  Keep all follow-up visits as told by your health care provider. This is important. SEEK MEDICAL CARE IF:  You received a tetanus shot and you have swelling, severe pain, redness, or bleeding at the injection site.  You have a fever.  Your sutures come out.  You notice a bad smell coming from your wound or your dressing.  You notice something coming out of your wound, such as wood or glass.  Your   pain is not controlled with medicine.  You have increased redness, swelling, or pain at the site of your wound.  You have fluid, blood, or pus coming from your wound.  You notice a change in the color of your skin near your wound.  You need to change the dressing frequently due to fluid, blood, or pus draining from your wound.  You develop a new rash.  You develop numbness around your wound. SEEK IMMEDIATE MEDICAL CARE IF:  You  develop severe swelling around your wound.  Your pain suddenly increases and is severe.  You develop painful skin lumps.  You have a red streak going away from your wound.  The wound is on your hand or foot and you cannot properly move a finger or toe.  The wound is on your hand or foot and you notice that your fingers or toes look pale or bluish.   This information is not intended to replace advice given to you by your health care provider. Make sure you discuss any questions you have with your health care provider.   Document Released: 08/10/2005 Document Revised: 07/22/2015 Document Reviewed: 12/24/2014 Elsevier Interactive Patient Education 2016 [employer]  

## 2015-11-18 NOTE — ED Provider Notes (Signed)
CSN: [id number]     Arrival date & time 11/18/15  1324 History   First MD Initiated Contact with Patient 11/18/15 1206     Chief Complaint  Patient presents with  . Extremity Laceration     (Consider location/radiation/quality/duration/timing/severity/associated sxs/prior Treatment) Patient is a 40 y.o. female presenting with skin laceration. The history is provided by the patient.  Laceration Location:  Hand Hand laceration location:  L hand Length (cm):  0.5 Depth:  Through underlying tissue Quality: straight   Bleeding: controlled   Injury mechanism: scissors. Pain details:    Quality:  Aching   Severity:  Mild   Timing:  Constant Foreign body present:  No foreign bodies Ineffective treatments:  None tried Tetanus status:  Out of date  Shawna Harris is a 40 y.o. female who presents to the ED with a puncture laceration to the dorsum of the left hand. She states that she was cutting a tag off a shirt and the scissors slipped and punctured her hand. She states it went deep and now she has bruising and swelling to the dorsum of the hand.   Past Medical History  Diagnosis Date  . Medical history non-contributory    Past Surgical History  Procedure Laterality Date  . No past surgeries     History reviewed. No pertinent family history. Social History  Substance Use Topics  . Smoking status: Current Every Day Smoker -- 0.50 packs/day    Types: Cigarettes  . Smokeless tobacco: None  . Alcohol Use: Yes     Comment: socially    OB History    Gravida Para Term Preterm AB TAB SAB Ectopic Multiple Living   6 4 4  1 1    4      Review of Systems  Skin: Positive for wound.  all other systems negative    Allergies  Review of patient's allergies indicates no known allergies.  Home Medications   Prior to Admission medications   Medication Sig Start Date End Date Taking? Authorizing Provider  aspirin 81 MG chewable tablet Chew 1 tablet (81 mg total) by mouth daily.  03/15/15   Currie Paris, NP  fluconazole (DIFLUCAN) 200 MG tablet Take 1 tablet (200 mg total) by mouth daily. 11/18/15 11/25/15  Kdyn Vonbehren Orlene Och, NP  labetalol (NORMODYNE) 200 MG tablet Take 1 tablet (200 mg total) by mouth 2 (two) times daily. 03/15/15   Currie Paris, NP  nicotine (NICODERM CQ - DOSED IN MG/24 HOURS) 21 mg/24hr patch Place 21 mg onto the skin daily.    Historical Provider, MD  Prenatal Vit-Fe Fumarate-FA (PRENATAL VITAMINS PLUS) 27-1 MG TABS Take 1 tablet by mouth daily. Generic is OK - Prenatal vitamin with 1 mg Folic acid 03/15/15   Currie Paris, NP  sulfamethoxazole-trimethoprim (BACTRIM DS,SEPTRA DS) 800-160 MG tablet Take 1 tablet by mouth 2 (two) times daily. 11/18/15 11/25/15  Kayana Thoen Orlene Och, NP   BP 160/65 mmHg  Pulse 98  Temp(Src) 97.8 F (36.6 C) (Oral)  Resp 18  Ht 5\' 5"  (1.651 m)  Wt 97.523 kg  BMI 35.78 kg/m2  SpO2 100%  LMP 11/11/2015  Breastfeeding? Unknown Physical Exam  Constitutional: She is oriented to person, place, and time. She appears well-developed and well-nourished. No distress.  HENT:  Head: Normocephalic and atraumatic.  Eyes: EOM are normal.  Neck: Neck supple.  Cardiovascular: Normal rate.   Pulmonary/Chest: Effort normal.  Musculoskeletal: Normal range of motion.       Left hand:  She exhibits tenderness and laceration. Normal sensation noted. Normal strength noted. She exhibits no thumb/finger opposition.       Hands: Puncture wound to the dorsum of the left hand with ecchymosis surrounding the wound. Tender on exam. Radial pulse 2+, adequate circulations.   Neurological: She is alert and oriented to person, place, and time. No cranial nerve deficit.  Skin: Skin is warm and dry.  Psychiatric: She has a normal mood and affect. Her behavior is normal.  Nursing note and vitals reviewed.   ED Course  Procedures  Soaked in NSS with peroxide and Betadine,  Bacitracin Ointment and dressing Tetanus updated  MDM  40 y.o. female with  puncture wound to the left hand stable for d/c without focal neuro deficits. Will treat with antibiotics and she will return as needed for any problems.   Final diagnoses:  Puncture wound, hand, left, initial encounter       [facility] M Gwendlyn Hanback, NP 11/18/15 2329  Lavera Guiseana Duo Liu, MD 11/19/15 [phone]

## 2015-11-18 NOTE — ED Notes (Signed)
Patient states that she cut her left hand earlier today with scissors. The patient reports that she is unsure of when the last tetanus shot was

## 2016-11-20 ENCOUNTER — Encounter (HOSPITAL_BASED_OUTPATIENT_CLINIC_OR_DEPARTMENT_OTHER): Payer: Self-pay | Admitting: *Deleted

## 2016-11-20 ENCOUNTER — Emergency Department (HOSPITAL_BASED_OUTPATIENT_CLINIC_OR_DEPARTMENT_OTHER)
Admission: EM | Admit: 2016-11-20 | Discharge: 2016-11-20 | Disposition: A | Discharge status: Home / Self Care | Payer: PRIVATE HEALTH INSURANCE | Attending: Emergency Medicine | Admitting: Emergency Medicine

## 2016-11-20 DIAGNOSIS — Z7982 Long term (current) use of aspirin: Secondary | ICD-10-CM | POA: Insufficient documentation

## 2016-11-20 DIAGNOSIS — F17210 Nicotine dependence, cigarettes, uncomplicated: Secondary | ICD-10-CM | POA: Insufficient documentation

## 2016-11-20 DIAGNOSIS — H73012 Bullous myringitis, left ear: Secondary | ICD-10-CM | POA: Diagnosis not present

## 2016-11-20 DIAGNOSIS — Z79899 Other long term (current) drug therapy: Secondary | ICD-10-CM | POA: Insufficient documentation

## 2016-11-20 DIAGNOSIS — H9202 Otalgia, left ear: Secondary | ICD-10-CM | POA: Diagnosis present

## 2016-11-20 MED ORDER — IBUPROFEN 800 MG PO TABS: 800.0000 mg | ORAL_TABLET | Freq: Once | ORAL | Status: DC

## 2016-11-20 MED ORDER — IBUPROFEN 800 MG PO TABS: 800.0000 mg | ORAL_TABLET | Freq: Three times a day (TID) | ORAL | 0 refills | Status: DC

## 2016-11-20 MED ORDER — AMOXICILLIN 500 MG PO CAPS: 500.0000 mg | ORAL_CAPSULE | Freq: Three times a day (TID) | ORAL | 0 refills | Status: DC

## 2016-11-20 MED ORDER — ACETAMINOPHEN 500 MG PO TABS
1000.0000 mg | ORAL_TABLET | Freq: Once | ORAL | Status: AC
  Administered 2016-11-20: 1000 mg via ORAL
  Filled 2016-11-20: qty 2

## 2016-11-20 NOTE — Discharge Instructions (Signed)
Your ear pain is likely caused by a condition called Bullous Myringitis, which is an infection of the tympanic membrane, caused by small fluid filled blisters form on the ear drum and cause severe pain.  Please alternate between tylenol and ibuprofen for pain.  Take antibiotic as prescribe.  Follow up with ENT specialist if no improvement.

## 2016-11-20 NOTE — ED Provider Notes (Signed)
[facility] Provider [facility]   CSN: [id number] Arrival date & time: 11/20/16  0947     History   Chief Complaint Chief Complaint  Patient presents with  . Otalgia    HPI Shawna Harris is a 41 y.o. female.  HPI   41 year old female presents complaining of left ear pain. Patient report cold symptoms for the past 1-2 weeks including where no sneezing coughing congestion which is mostly resolved. She does complain of some mild sinus congestion and this morning she was blowing her nose when she developed an acute onset of pain to her left ear. She described as a sharp stabbing pain with a Fizzing sensation, ringing in her ear.  Pain radiates down to L jaw with tingling sensation.  Denies hearing changes, fever, severe headache, neck stiffness, dental pain. No recent injury. She took for ibuprofen an hour ago but states pain is now returning. Patient does not want any narcotic pain medication. No history of diabetes.  Hx of hypertension and her blood pressure initially was elevated.   Past Medical History:  Diagnosis Date  . Medical history non-contributory     Patient Active Problem List   Diagnosis Date Noted  . [redacted] weeks gestation of pregnancy   . Abdominal pain in pregnancy, antepartum   . No prenatal care in current pregnancy     Past Surgical History:  Procedure Laterality Date  . NO PAST SURGERIES      OB History    Gravida Para Term Preterm AB Living   6 4 4   1 4    SAB TAB Ectopic Multiple Live Births     1             Home Medications    Prior to Admission medications   Medication Sig Start Date End Date Taking? Authorizing Provider  aspirin 81 MG chewable tablet Chew 1 tablet (81 mg total) by mouth daily. 03/15/15   Currie Pariserri L Burleson, NP  labetalol (NORMODYNE) 200 MG tablet Take 1 tablet (200 mg total) by mouth 2 (two) times daily. 03/15/15   Currie Pariserri L Burleson, NP  nicotine (NICODERM CQ - DOSED IN MG/24 HOURS) 21 mg/24hr patch Place 21 mg onto the  skin daily.    Historical Provider, MD  Prenatal Vit-Fe Fumarate-FA (PRENATAL VITAMINS PLUS) 27-1 MG TABS Take 1 tablet by mouth daily. Generic is OK - Prenatal vitamin with 1 mg Folic acid 03/15/15   Currie Pariserri L Burleson, NP    Family History No family history on file.  Social History Social History  Substance Use Topics  . Smoking status: Current Every Day Smoker    Packs/day: 0.50    Types: Cigarettes  . Smokeless tobacco: Not on file  . Alcohol use Yes     Comment: socially      Allergies   Patient has no known allergies.   Review of Systems Review of Systems  Constitutional: Negative for fever.  HENT: Positive for ear pain. Negative for ear discharge and hearing loss.      Physical Exam Updated Vital Signs BP (!) 180/113 (BP Location: Right Arm) Comment: pt in severe pain at this time  Pulse 93   Temp 98.5 F (36.9 C)   Resp 22   Ht 5\' 5"  (1.651 m)   Wt 99.8 kg   LMP 10/28/2016 (Approximate)   SpO2 100%   BMI 36.61 kg/m   Physical Exam  Constitutional: She appears well-developed and well-nourished. No distress.  HENT:  Head: Normocephalic  and atraumatic.  Nose: Nose normal.  Ears: R TM normal.  L TM is erythematous, bulging without obvious perforation  Tenderness to the tragus.  No evidence of mastoiditis.  Normal dentition. No trismus.  No tmj  Eyes: Conjunctivae are normal.  Neck: Neck supple.  No carotid bruits  Neurological: She is alert.  Skin: No rash noted.  Psychiatric: She has a normal mood and affect.  Nursing note and vitals reviewed.    ED Treatments / Results  Labs (all labs ordered are listed, but only abnormal results are displayed) Labs Reviewed - No data to display  EKG  EKG Interpretation None       Radiology No results found.  Procedures Procedures (including critical care time)  Medications Ordered in ED Medications - No data to display   Initial Impression / Assessment and Plan / ED Course  I have reviewed the  triage vital signs and the nursing notes.  Pertinent labs & imaging results that were available during my care of the patient were reviewed by me and considered in my medical decision making (see chart for details).  Clinical Course     BP (!) 180/113 (BP Location: Right Arm) Comment: pt in severe pain at this time  Pulse 93   Temp 98.5 F (36.9 C)   Resp 22   Ht 5\' 5"  (1.651 m)   Wt 99.8 kg   LMP 10/28/2016 (Approximate)   SpO2 100%   BMI 36.61 kg/m   The patient was noted to be hypertensive today in the emergency department. I have spoken with the patient regarding hypertension and the need for improved management. I instructed the patient to followup with the Primary care doctor within 4 days to improve the management of the patient's hypertension. I also counseled the patient regarding the signs and symptoms which would require an emergent visit to an emergency department for hypertensive urgency and/or hypertensive emergency. The patient understood the need for improved hypertensive management.   Final Clinical Impressions(s) / ED Diagnoses   Final diagnoses:  Bullous myringitis of left ear    New Prescriptions New Prescriptions   AMOXICILLIN (AMOXIL) 500 MG CAPSULE    Take 1 capsule (500 mg total) by mouth 3 (three) times daily.   IBUPROFEN (ADVIL,MOTRIN) 800 MG TABLET    Take 1 tablet (800 mg total) by mouth 3 (three) times daily.   10:56 AM Pt with acute onset of L ear pain after blowing her nose.  L TM is erythematous with small blisters present.  No obvous TM perforation noted.  Doubt vertebral artery dissection or dental infection.  Plan to treat with tylenol/ibuprofen and amox.  ENT referral given as needed.  Care discussed with Dr. Clydene Pugh.     Fayrene Helper, PA-C 11/20/16 1105    Lyndal Pulley, MD 11/20/16 [phone]

## 2016-11-20 NOTE — ED Triage Notes (Addendum)
Pt reports L ear pain that began suddenly around 0600. States she's currently getting over a cold and still has some nasal congestion. Denies fever, cough, sore throat, n/v/d. Reports taking 800mg  Motrin PTA.

## 2016-12-12 ENCOUNTER — Ambulatory Visit (INDEPENDENT_AMBULATORY_CARE_PROVIDER_SITE_OTHER): Payer: PRIVATE HEALTH INSURANCE | Admitting: Otolaryngology

## 2017-01-29 ENCOUNTER — Encounter (HOSPITAL_BASED_OUTPATIENT_CLINIC_OR_DEPARTMENT_OTHER): Payer: Self-pay

## 2017-01-29 ENCOUNTER — Emergency Department (HOSPITAL_BASED_OUTPATIENT_CLINIC_OR_DEPARTMENT_OTHER): Payer: PRIVATE HEALTH INSURANCE

## 2017-01-29 ENCOUNTER — Emergency Department (HOSPITAL_BASED_OUTPATIENT_CLINIC_OR_DEPARTMENT_OTHER)
Admission: EM | Admit: 2017-01-29 | Discharge: 2017-01-29 | Disposition: A | Discharge status: Home / Self Care | Payer: PRIVATE HEALTH INSURANCE | Attending: Emergency Medicine | Admitting: Emergency Medicine

## 2017-01-29 DIAGNOSIS — R062 Wheezing: Secondary | ICD-10-CM | POA: Insufficient documentation

## 2017-01-29 DIAGNOSIS — R0981 Nasal congestion: Secondary | ICD-10-CM | POA: Insufficient documentation

## 2017-01-29 DIAGNOSIS — R0602 Shortness of breath: Secondary | ICD-10-CM | POA: Insufficient documentation

## 2017-01-29 DIAGNOSIS — F17210 Nicotine dependence, cigarettes, uncomplicated: Secondary | ICD-10-CM | POA: Insufficient documentation

## 2017-01-29 DIAGNOSIS — Z7982 Long term (current) use of aspirin: Secondary | ICD-10-CM | POA: Insufficient documentation

## 2017-01-29 DIAGNOSIS — R05 Cough: Secondary | ICD-10-CM | POA: Insufficient documentation

## 2017-01-29 DIAGNOSIS — J988 Other specified respiratory disorders: Secondary | ICD-10-CM

## 2017-01-29 MED ORDER — DEXAMETHASONE 6 MG PO TABS
10.0000 mg | ORAL_TABLET | Freq: Once | ORAL | Status: AC
  Administered 2017-01-29: 10 mg via ORAL
  Filled 2017-01-29: qty 1

## 2017-01-29 MED ORDER — ALBUTEROL SULFATE (2.5 MG/3ML) 0.083% IN NEBU
2.5000 mg | INHALATION_SOLUTION | Freq: Once | RESPIRATORY_TRACT | Status: AC
  Administered 2017-01-29: 2.5 mg via RESPIRATORY_TRACT
  Filled 2017-01-29: qty 3

## 2017-01-29 MED ORDER — IPRATROPIUM-ALBUTEROL 0.5-2.5 (3) MG/3ML IN SOLN
3.0000 mL | Freq: Once | RESPIRATORY_TRACT | Status: AC
  Administered 2017-01-29: 3 mL via RESPIRATORY_TRACT
  Filled 2017-01-29: qty 3

## 2017-01-29 MED ORDER — ALBUTEROL SULFATE HFA 108 (90 BASE) MCG/ACT IN AERS
6.0000 | INHALATION_SPRAY | Freq: Once | RESPIRATORY_TRACT | Status: AC
  Administered 2017-01-29: 6 via RESPIRATORY_TRACT
  Filled 2017-01-29: qty 6.7

## 2017-01-29 NOTE — Discharge Instructions (Signed)
Take tylenol 2 pills 4 times a day and motrin 4 pills 3 times a day.  Drink plenty of fluids.  Return for worsening shortness of breath, headache, confusion. Follow up with your family doctor.   

## 2017-01-29 NOTE — ED Provider Notes (Signed)
[facility] Provider Note   CSN: [id number] Arrival date & time: 01/29/17  2137   By signing my name below, I, Clovis Pu, attest that this documentation has been prepared under the direction and in the presence of Melene Plan, DO  Electronically Signed: Clovis Pu, ED Scribe. 01/29/17. 10:05 PM.  History   Chief Complaint Chief Complaint  Patient presents with  . Shortness of Breath   The history is provided by the patient. No language interpreter was used.   HPI Comments:  Shawna Harris is a 41 y.o. female who presents to the Emergency Department complaining of acute onset, gradually worsening SOB which began around 5:30 PM today. Pt also reports cold symptoms, such as a cough and congestion, onset 2 days. She states she has been taking Emergen-C with relief. She denies a hx of asthma or any other associated symptoms. Pt is a smoker. No other complaints noted.   Past Medical History:  Diagnosis Date  . Medical history non-contributory     Patient Active Problem List   Diagnosis Date Noted  . [redacted] weeks gestation of pregnancy   . Abdominal pain in pregnancy, antepartum   . No prenatal care in current pregnancy     Past Surgical History:  Procedure Laterality Date  . NO PAST SURGERIES      OB History    Gravida Para Term Preterm AB Living   6 4 4   1 4    SAB TAB Ectopic Multiple Live Births     1             Home Medications    Prior to Admission medications   Medication Sig Start Date End Date Taking? Authorizing Provider  amoxicillin (AMOXIL) 500 MG capsule Take 1 capsule (500 mg total) by mouth 3 (three) times daily. 11/20/16   Fayrene Helper, PA-C  aspirin 81 MG chewable tablet Chew 1 tablet (81 mg total) by mouth daily. 03/15/15   Currie Paris, NP  ibuprofen (ADVIL,MOTRIN) 800 MG tablet Take 1 tablet (800 mg total) by mouth 3 (three) times daily. 11/20/16   Fayrene Helper, PA-C  labetalol (NORMODYNE) 200 MG tablet Take 1 tablet (200 mg total) by  mouth 2 (two) times daily. 03/15/15   Currie Paris, NP  Multiple Vitamin (MULTIVITAMIN) tablet Take 1 tablet by mouth daily.    Historical Provider, MD  nicotine (NICODERM CQ - DOSED IN MG/24 HOURS) 21 mg/24hr patch Place 21 mg onto the skin daily.    Historical Provider, MD  Prenatal Vit-Fe Fumarate-FA (PRENATAL VITAMINS PLUS) 27-1 MG TABS Take 1 tablet by mouth daily. Generic is OK - Prenatal vitamin with 1 mg Folic acid 03/15/15   Currie Paris, NP    Family History No family history on file.  Social History Social History  Substance Use Topics  . Smoking status: Current Every Day Smoker    Packs/day: 0.50    Types: Cigarettes  . Smokeless tobacco: Never Used  . Alcohol use Yes     Comment: socially      Allergies   Patient has no known allergies.   Review of Systems Review of Systems  Constitutional: Negative for chills and fever.  HENT: Positive for congestion. Negative for rhinorrhea and sinus pain.   Eyes: Negative for redness and visual disturbance.  Respiratory: Positive for cough and shortness of breath. Negative for wheezing.   Cardiovascular: Negative for chest pain and palpitations.  Gastrointestinal: Negative for nausea and vomiting.  Genitourinary: Negative  for dysuria and urgency.  Musculoskeletal: Negative for arthralgias and myalgias.  Skin: Negative for pallor and wound.  Neurological: Negative for dizziness and headaches.   Physical Exam Updated Vital Signs BP (!) 155/95   Pulse (!) 106   Temp 99.6 F (37.6 C) (Oral)   Resp 20   Ht 5' (1.524 m)   Wt 220 lb (99.8 kg)   LMP 01/26/2017   SpO2 98%   BMI 42.97 kg/m   Physical Exam  Constitutional: She is oriented to person, place, and time. She appears well-developed and well-nourished. No distress.  HENT:  Head: Normocephalic and atraumatic.  Right Ear: Tympanic membrane normal.  Left Ear: Tympanic membrane normal.  Swollen turbinates. Posterior nasal drip.   Eyes: EOM are normal. Pupils  are equal, round, and reactive to light.  Neck: Normal range of motion. Neck supple.  Cardiovascular: Regular rhythm.  Tachycardia present.  Exam reveals no gallop and no friction rub.   No murmur heard. Pulmonary/Chest: Effort normal. Tachypnea (mild) noted. She has no wheezes. She has no rales.  Transient upper airway noise throughout.   Abdominal: Soft. She exhibits no distension. There is no tenderness.  Musculoskeletal: She exhibits no edema or tenderness.  Neurological: She is alert and oriented to person, place, and time.  Skin: Skin is warm and dry. She is not diaphoretic.  Psychiatric: She has a normal mood and affect. Her behavior is normal.  Nursing note and vitals reviewed.    ED Treatments / Results  DIAGNOSTIC STUDIES:  Oxygen Saturation is 99% on RA, normal by my interpretation.    COORDINATION OF CARE:  10:02 PM Discussed treatment plan with pt at bedside and pt agreed to plan.  Labs (all labs ordered are listed, but only abnormal results are displayed) Labs Reviewed - No data to display  EKG  EKG Interpretation  Date/Time:  Sunday January 29 2017 21:42:08 EDT Ventricular Rate:  106 PR Interval:  124 QRS Duration: 68 QT Interval:  328 QTC Calculation: 435 R Axis:   54 Text Interpretation:  Sinus tachycardia Otherwise normal ECG st changes likely rate related Otherwise no significant change Confirmed by Avila Albritton MD, DANIEL ([id number]) on 01/29/2017 9:49:27 PM       Radiology Dg Chest 2 View  Result Date: 01/29/2017 CLINICAL DATA:  Chest tightening, wheezing, respiratory difficulty, and cough. Smoker. EXAM: CHEST  2 VIEW COMPARISON:  10/18/2008 FINDINGS: The heart size and mediastinal contours are within normal limits. Both lungs are clear. The visualized skeletal structures are unremarkable. IMPRESSION: No active cardiopulmonary disease. Electronically Signed   By: William  Stevens M.D.   On: 01/29/2017 22:19    Procedures Procedures (including critical care  time)  Medications Ordered in ED Medications  ipratropium-albuterol (DUONEB) 0.5-2.5 (3) MG/3ML nebulizer solution 3 mL (3 mLs Nebulization Given 01/29/17 2149)  albuterol (PROVENTIL) (2.5 MG/3ML) 0.083% nebulizer solution 2.5 mg (2.5 mg Nebulization Given 01/29/17 2149)  dexamethasone (DECADRON) tablet 10 mg (10 mg Oral Given 01/29/17 2218)  albuterol (PROVENTIL HFA;VENTOLIN HFA) 108 (90 Base) MCG/ACT inhaler 6 puff (6 puffs Inhalation Given 01/29/17 2213)     Initial Impression / Assessment and Plan / ED Course  I have reviewed the triage vital signs and the nursing notes.  Pertinent labs & imaging results that were available during my care of the patient were reviewed by me and considered in my medical decision making (see chart for details).     40  yo F with a cc of sob.  Going on for past  week.  Associated with a URI. Patient came out of getting her nails done and felt that she was having significant worsening shortness of breath. Patient with transmitted upper airway noises on my exam cleared with coughing. She did have significant improvement with a breathing treatment and therefore I'll give her an albuterol inhaler and decadron.  D/c home.   Discussed smoking cessation with patient and was they were offerred resources to help stop.  Total time was 5 min CPT code 16109.   11:47 PM:  I have discussed the diagnosis/risks/treatment options with the patient and family and believe the pt to be eligible for discharge home to follow-up with PCP. We also discussed returning to the ED immediately if new or worsening sx occur. We discussed the sx which are most concerning (e.g., sudden worsening pain, fever, inability to tolerate by mouth) that necessitate immediate return. Medications administered to the patient during their visit and any new prescriptions provided to the patient are listed below.  Medications given during this visit Medications  ipratropium-albuterol (DUONEB) 0.5-2.5 (3) MG/3ML  nebulizer solution 3 mL (3 mLs Nebulization Given 01/29/17 2149)  albuterol (PROVENTIL) (2.5 MG/3ML) 0.083% nebulizer solution 2.5 mg (2.5 mg Nebulization Given 01/29/17 2149)  dexamethasone (DECADRON) tablet 10 mg (10 mg Oral Given 01/29/17 2218)  albuterol (PROVENTIL HFA;VENTOLIN HFA) 108 (90 Base) MCG/ACT inhaler 6 puff (6 puffs Inhalation Given 01/29/17 2213)     The patient appears reasonably screen and/or stabilized for discharge and I doubt any other medical condition or other [facility] requiring further screening, evaluation, or treatment in the ED at this time prior to discharge.    Final Clinical Impressions(s) / ED Diagnoses   Final diagnoses:  Wheezing-associated respiratory infection (WARI)    New Prescriptions Discharge Medication List as of 01/29/2017 11:20 PM     I personally performed the services described in this documentation, which was scribed in my presence. The recorded information has been reviewed and is accurate.     Melene Plan, DO 01/29/17 2347

## 2017-01-29 NOTE — ED Triage Notes (Signed)
Pt reports cold light symptoms on Friday. Reports having some chest pain yesterday. Sts she went to nail salon today and started having shortness of breath. RT to triage. Pt with expiratory wheezing and tachypnea in triage.

## 2019-01-27 ENCOUNTER — Other Ambulatory Visit: Payer: Self-pay

## 2019-01-27 ENCOUNTER — Encounter (HOSPITAL_BASED_OUTPATIENT_CLINIC_OR_DEPARTMENT_OTHER): Payer: Self-pay

## 2019-01-27 ENCOUNTER — Emergency Department (HOSPITAL_BASED_OUTPATIENT_CLINIC_OR_DEPARTMENT_OTHER)
Admission: EM | Admit: 2019-01-27 | Discharge: 2019-01-27 | Disposition: A | Discharge status: Home / Self Care | Payer: BLUE CROSS/BLUE SHIELD | Attending: Emergency Medicine | Admitting: Emergency Medicine

## 2019-01-27 DIAGNOSIS — F419 Anxiety disorder, unspecified: Secondary | ICD-10-CM | POA: Insufficient documentation

## 2019-01-27 DIAGNOSIS — F17210 Nicotine dependence, cigarettes, uncomplicated: Secondary | ICD-10-CM | POA: Insufficient documentation

## 2019-01-27 DIAGNOSIS — I10 Essential (primary) hypertension: Secondary | ICD-10-CM | POA: Insufficient documentation

## 2019-01-27 LAB — PREGNANCY, URINE: PREG TEST UR: NEGATIVE

## 2019-01-27 MED ORDER — AMLODIPINE BESYLATE 5 MG PO TABS: 5.0000 mg | ORAL_TABLET | Freq: Every day | ORAL | 0 refills | Status: DC

## 2019-01-27 NOTE — ED Provider Notes (Signed)
[facility] EMERGENCY DEPARTMENT Provider Note   CSN: [id number] Arrival date & time: 01/27/19  1551    History   Chief Complaint Chief Complaint  Patient presents with  . Hypertension    HPI Shawna Harris is a 43 y.o. female.     43 year old female with no known past medical problems who presents with hypertension.  Patient states that she has been under a lot of stress recently due to working a lot and has worked the last 8 days.  She reports feeling anxious.  Today she went to the pharmacy to buy some stuff and while there she saw a blood pressure cuff and decided to check her blood pressure.  She noted that it was severely elevated to 180s systolic.  She went to lunch and then came to the ER for evaluation. She denies any symptoms aside from anxiety, including no chest pain, shortness of breath, fevers, vomiting, diarrhea, URI symptoms, recent illness, recent travel, or leg swelling/pain. She was mainly concerned at the high number.  She does admit that she does not follow-up with a primary care provider as she should.  1 year ago she had a work-related physical and was hypertensive at that visit.  She never followed up about this.  She has never had a formal diagnosis of hypertension and has never taken antihypertensive medications.  She denies any significant alcohol use and denies any drug use.  The history is provided by the patient.  Hypertension     Past Medical History:  Diagnosis Date  . Medical history non-contributory     Patient Active Problem List   Diagnosis Date Noted  . [redacted] weeks gestation of pregnancy   . Abdominal pain in pregnancy, antepartum   . No prenatal care in current pregnancy     Past Surgical History:  Procedure Laterality Date  . NO PAST SURGERIES       OB History    Gravida  6   Para  4   Term  4   Preterm      AB  1   Living  4     SAB      TAB  1   Ectopic      Multiple      Live Births              Home Medications    Prior to Admission medications   Medication Sig Start Date End Date Taking? Authorizing Provider  amoxicillin (AMOXIL) 500 MG capsule Take 1 capsule (500 mg total) by mouth 3 (three) times daily. 11/20/16   Fayrene Helper, PA-C  aspirin 81 MG chewable tablet Chew 1 tablet (81 mg total) by mouth daily. 03/15/15   Burleson, Brand Males, NP  ibuprofen (ADVIL,MOTRIN) 800 MG tablet Take 1 tablet (800 mg total) by mouth 3 (three) times daily. 11/20/16   Fayrene Helper, PA-C  labetalol (NORMODYNE) 200 MG tablet Take 1 tablet (200 mg total) by mouth 2 (two) times daily. 03/15/15   Currie Paris, NP  Multiple Vitamin (MULTIVITAMIN) tablet Take 1 tablet by mouth daily.    [provider]  nicotine (NICODERM CQ - DOSED IN MG/24 HOURS) 21 mg/24hr patch Place 21 mg onto the skin daily.    [provider]  Prenatal Vit-Fe Fumarate-FA (PRENATAL VITAMINS PLUS) 27-1 MG TABS Take 1 tablet by mouth daily. Generic is OK - Prenatal vitamin with 1 mg Folic acid 03/15/15   Burleson, Brand Males, NP    Family  History No family history on file.  Social History Social History   Tobacco Use  . Smoking status: Current Every Day Smoker    Packs/day: 0.50    Types: Cigarettes  . Smokeless tobacco: Never Used  Substance Use Topics  . Alcohol use: Yes    Comment: socially   . Drug use: No     Allergies   Patient has no known allergies.   Review of Systems Review of Systems All other systems reviewed and are negative except that which was mentioned in HPI   Physical Exam Updated Vital Signs BP (!) 173/131 (BP Location: Left Arm)   Pulse (!) 108   Temp 98.5 F (36.9 C) (Oral)   Resp 18   Ht 5\' 5"  (1.651 m)   Wt 106.6 kg   LMP 01/20/2019   SpO2 100%   BMI 39.11 kg/m   Physical Exam Vitals signs and nursing note reviewed.  Constitutional:      General: She is not in acute distress.    Appearance: She is well-developed.  HENT:     Head: Normocephalic and atraumatic.      Nose: Nose normal.  Eyes:     Conjunctiva/sclera: Conjunctivae normal.  Neck:     Musculoskeletal: Neck supple.  Cardiovascular:     Rate and Rhythm: Regular rhythm. Tachycardia present.     Heart sounds: Normal heart sounds. No murmur.  Pulmonary:     Effort: Pulmonary effort is normal.     Breath sounds: Normal breath sounds.  Abdominal:     General: Bowel sounds are normal. There is no distension.     Palpations: Abdomen is soft.     Tenderness: There is no abdominal tenderness.  Musculoskeletal:     Right lower leg: No edema.     Left lower leg: No edema.  Skin:    General: Skin is warm and dry.  Neurological:     Mental Status: She is alert and oriented to person, place, and time.     Comments: Fluent speech  Psychiatric:        Mood and Affect: Mood is anxious.        Judgment: Judgment normal.      ED Treatments / Results  Labs (all labs ordered are listed, but only abnormal results are displayed) Labs Reviewed  PREGNANCY, URINE    EKG EKG Interpretation  Date/Time:  Sunday January 27 2019 16:02:51 EDT Ventricular Rate:  108 PR Interval:    QRS Duration: 76 QT Interval:  315 QTC Calculation: 423 R Axis:   55 Text Interpretation:  Sinus tachycardia Probable left atrial enlargement Borderline T wave abnormalities Baseline wander in lead(s) V1 V3 V5 similar to previous Confirmed by Frederick Peers [phone]) on 01/27/2019 4:25:29 PM   Radiology No results found.  Procedures Procedures (including critical care time)  Medications Ordered in ED Medications - No data to display   Initial Impression / Assessment and Plan / ED Course  I have reviewed the triage vital signs and the nursing notes.  Pertinent labs  that were available during my care of the patient were reviewed by me and considered in my medical decision making (see chart for details).        She was anxious but otherwise well-appearing on exam, mildly tachycardic but I suspect that this is  due to anxiety as she denies any other complaints whatsoever.  She also has no risk factors for PE and given her lack of complaints, I do not feel  she needs any work-up regarding this.  No symptoms to suggest hypertensive emergency.  I did recommend that she follow-up with a primary care provider as soon as possible and she has assured me that she will.  She understands that she will need recheck of blood pressure as well as screening lab work to include thyroid studies.  I discussed options with her and given her previous history of elevated blood pressure values, I offered to initiate antihypertensive versus observation follow-up.  She would prefer to start a medication.  Will start amlodipine and I have extensively reviewed return precautions.  Final Clinical Impressions(s) / ED Diagnoses   Final diagnoses:  None    ED Discharge Orders    None       Azelie Noguera, Ambrose Finlandachel Morgan, MD 01/27/19 1627

## 2019-01-27 NOTE — ED Notes (Signed)
Pt appears anxious during exam. HR went up to 120 briefly, but pt was easily distracted and HR came down to 106.

## 2019-01-27 NOTE — ED Triage Notes (Addendum)
Pt states she has been working 8 days straight and today checked her B/P in [place] while she was feeling nervous. Pt noted a B/P of 185/132. Pt denies hx of HTN. Pt is asymptomatic.

## 2019-02-04 ENCOUNTER — Telehealth: Payer: Self-pay | Admitting: *Deleted

## 2019-02-04 NOTE — Telephone Encounter (Signed)
Shawna Harris called patient to reschedule her NP Appointment with [name]. Patient had concerns. Patient is a nurse at the hospital. Blood Pressures runs 172/131-186/131 was prescribe Amlodipine. Taking this daily. No other medications. Has not kept a log. Patient DOES NOT have a PCP.  Patient stated that she is ok with doing a Virtual Visit. Please Advise.    Patient called c/o high blood pressure  1. What are your blood pressure readings?      172/131-186/131  2. When were the above readings checked?  She doesn't have a BP monitor. She goes to [facility] or [facility] to check. She takes her medication between 8-10 in the morning and usually checks her blood pressure later. Stated that she is going to get her a monitor but hasn't yet.   3 Any associated symptoms like headache, dizziness, chest pain, shortness of breath, weakness or numbness of leg/arm, or speech changes? No symptoms  4. Did you take your blood pressure medications (if patient on b/p medication, check medication list)? Amlodipine daily between 8-10  5. Did you take them at least a hour prior to checking your blood pressure? Yes  6. Have you been watching your salt in your diet? Yes she watches her Sodium. Thinks it is mainly stress related.   7. Have you eaten any salty foods like breakfast meats, ham, soups, canned foods, or snacks? She does eat bacon rarely.   I will forward your response to your provider and call you with instructions, if your symptoms persist or progress seek medical attention at urgent care or the emergency room.

## 2019-02-04 NOTE — Telephone Encounter (Signed)
Per [name]...have patient call her PCP Dr. Concepcion Elk to have them fax OV notes for [name] to review before calling for Televisit. Patient stated that she will have these faxed. Fax number given.

## 2019-02-04 NOTE — Telephone Encounter (Signed)
Patient requested records but stated that she has not seen a PCP in years. Please Advise.

## 2019-02-04 NOTE — Telephone Encounter (Signed)
Lets have her increase her norvasc to 10 mg by mouth daily and have her follow up in office for recheck/est care. To go to the ED if BP worsens or she becomes symptomatic

## 2019-02-04 NOTE — Telephone Encounter (Signed)
Please check with Pamelia Hoit in regards to this I am not sure of the regulations with new pts and tele-visits.

## 2019-02-04 NOTE — Telephone Encounter (Signed)
LMOM to return call.

## 2019-02-05 NOTE — Telephone Encounter (Signed)
LMOM to return call.

## 2019-02-06 NOTE — Telephone Encounter (Signed)
LMOM to return call.

## 2019-02-07 NOTE — Telephone Encounter (Signed)
We have maximized our attempts to reach patient by phone. A letter will be mailed to request a return call. Encounter will be closed due to unsuccessful attempts to reach patient by phone.  If and when patient returns call an addendum or new telephone encounter will be created to further address.  S.Chrae B/CMA

## 2019-02-07 NOTE — Telephone Encounter (Signed)
Letter printed and mailed to patient.

## 2019-02-14 ENCOUNTER — Other Ambulatory Visit: Payer: Self-pay | Admitting: Internal Medicine

## 2019-02-14 DIAGNOSIS — Z1231 Encounter for screening mammogram for malignant neoplasm of breast: Secondary | ICD-10-CM

## 2019-02-19 ENCOUNTER — Telehealth: Payer: Self-pay

## 2019-02-19 NOTE — Telephone Encounter (Signed)
Left message on voicemail for patient to return call when available   Reason for call: Patient is scheduled for a new patient appointment on Monday 02/25/2019, I was calling to check the status of new patient packet and to offer Tele-Visit as an option.   Awaiting return call

## 2019-02-21 NOTE — Telephone Encounter (Signed)
Left message on voicemail for patient to return call when available   

## 2019-02-25 ENCOUNTER — Ambulatory Visit: Payer: Self-pay | Admitting: Nurse Practitioner

## 2019-02-25 ENCOUNTER — Other Ambulatory Visit: Payer: Self-pay

## 2019-02-25 NOTE — Telephone Encounter (Signed)
Left message on voicemail for patient to return call when available   

## 2019-02-27 NOTE — Telephone Encounter (Signed)
Patient did not show for appointment on Monday 02/25/2019. Encounter closed due to multiple attempts to contact patient with no return call

## 2019-04-16 ENCOUNTER — Ambulatory Visit: Payer: PRIVATE HEALTH INSURANCE

## 2019-11-10 ENCOUNTER — Emergency Department (HOSPITAL_BASED_OUTPATIENT_CLINIC_OR_DEPARTMENT_OTHER)
Admission: EM | Admit: 2019-11-10 | Discharge: 2019-11-10 | Disposition: A | Discharge status: Home / Self Care | Payer: 59 | Attending: Emergency Medicine | Admitting: Emergency Medicine

## 2019-11-10 ENCOUNTER — Other Ambulatory Visit: Payer: Self-pay

## 2019-11-10 ENCOUNTER — Encounter (HOSPITAL_BASED_OUTPATIENT_CLINIC_OR_DEPARTMENT_OTHER): Payer: Self-pay | Admitting: Emergency Medicine

## 2019-11-10 ENCOUNTER — Emergency Department (HOSPITAL_BASED_OUTPATIENT_CLINIC_OR_DEPARTMENT_OTHER): Payer: 59

## 2019-11-10 DIAGNOSIS — Z7982 Long term (current) use of aspirin: Secondary | ICD-10-CM | POA: Insufficient documentation

## 2019-11-10 DIAGNOSIS — F17210 Nicotine dependence, cigarettes, uncomplicated: Secondary | ICD-10-CM | POA: Diagnosis not present

## 2019-11-10 DIAGNOSIS — Z79899 Other long term (current) drug therapy: Secondary | ICD-10-CM | POA: Insufficient documentation

## 2019-11-10 DIAGNOSIS — R1030 Lower abdominal pain, unspecified: Secondary | ICD-10-CM | POA: Diagnosis not present

## 2019-11-10 DIAGNOSIS — N939 Abnormal uterine and vaginal bleeding, unspecified: Secondary | ICD-10-CM

## 2019-11-10 DIAGNOSIS — O046 Delayed or excessive hemorrhage following (induced) termination of pregnancy: Secondary | ICD-10-CM | POA: Insufficient documentation

## 2019-11-10 LAB — BASIC METABOLIC PANEL
Anion gap: 7 (ref 5–15)
BUN: 8 mg/dL (ref 6–20)
CO2: 20 mmol/L — ABNORMAL LOW (ref 22–32)
Calcium: 9 mg/dL (ref 8.9–10.3)
Chloride: 107 mmol/L (ref 98–111)
Creatinine, Ser: 0.62 mg/dL (ref 0.44–1.00)
GFR calc Af Amer: >60 mL/min (ref >60)
GFR calc non Af Amer: >60 mL/min (ref >60)
Glucose, Bld: 147 mg/dL — ABNORMAL HIGH (ref 70–99)
Potassium: 3.5 mmol/L (ref 3.5–5.1)
Sodium: 134 mmol/L — ABNORMAL LOW (ref 135–145)

## 2019-11-10 LAB — CBC WITH DIFFERENTIAL/PLATELET
Abs Immature Granulocytes: 0.08 10*3/uL — ABNORMAL HIGH (ref 0.00–0.07)
Basophils Absolute: 0.1 10*3/uL (ref 0.0–0.1)
Basophils Relative: 0 %
Eosinophils Absolute: 0.1 10*3/uL (ref 0.0–0.5)
Eosinophils Relative: 1 %
HCT: 35 % — ABNORMAL LOW (ref 36.0–46.0)
Hemoglobin: 11.6 g/dL — ABNORMAL LOW (ref 12.0–15.0)
Immature Granulocytes: 1 %
Lymphocytes Relative: 26 %
Lymphs Abs: 3.1 10*3/uL (ref 0.7–4.0)
MCH: 29.4 pg (ref 26.0–34.0)
MCHC: 33.1 g/dL (ref 30.0–36.0)
MCV: 88.8 fL (ref 80.0–100.0)
Monocytes Absolute: 1.2 10*3/uL — ABNORMAL HIGH (ref 0.1–1.0)
Monocytes Relative: 10 %
Neutro Abs: 7.5 10*3/uL (ref 1.7–7.7)
Neutrophils Relative %: 62 %
Platelets: 350 10*3/uL (ref 150–400)
RBC: 3.94 MIL/uL (ref 3.87–5.11)
RDW: 13.2 % (ref 11.5–15.5)
WBC: 12.1 10*3/uL — ABNORMAL HIGH (ref 4.0–10.5)
nRBC: 0 % (ref 0.0–0.2)

## 2019-11-10 LAB — URINALYSIS, ROUTINE W REFLEX MICROSCOPIC
Bilirubin Urine: NEGATIVE
Glucose, UA: NEGATIVE mg/dL
Ketones, ur: NEGATIVE mg/dL
Leukocytes,Ua: NEGATIVE
Nitrite: NEGATIVE
Protein, ur: NEGATIVE mg/dL
Specific Gravity, Urine: 1.02 (ref 1.005–1.030)
pH: 6 (ref 5.0–8.0)

## 2019-11-10 LAB — WET PREP, GENITAL
Clue Cells Wet Prep HPF POC: NONE SEEN
Sperm: NONE SEEN
Trich, Wet Prep: NONE SEEN
Yeast Wet Prep HPF POC: NONE SEEN

## 2019-11-10 LAB — URINALYSIS, MICROSCOPIC (REFLEX): RBC / HPF: >50 RBC/hpf (ref 0–5)

## 2019-11-10 LAB — HCG, QUANTITATIVE, PREGNANCY: hCG, Beta Chain, Quant, S: 918 m[IU]/mL — ABNORMAL HIGH (ref <5)

## 2019-11-10 LAB — PREGNANCY, URINE: Preg Test, Ur: POSITIVE — AB

## 2019-11-10 MED ORDER — MISOPROSTOL 200 MCG PO TABS
600.0000 ug | ORAL_TABLET | Freq: Once | ORAL | Status: AC
  Administered 2019-11-10: 600 ug via ORAL
  Filled 2019-11-10: qty 3

## 2019-11-10 MED ORDER — SODIUM CHLORIDE 0.9 % IV BOLUS
1000.0000 mL | Freq: Once | INTRAVENOUS | Status: AC
  Administered 2019-11-10: 1000 mL via INTRAVENOUS

## 2019-11-10 MED ORDER — IBUPROFEN 800 MG PO TABS
800.0000 mg | ORAL_TABLET | Freq: Once | ORAL | Status: AC
  Administered 2019-11-10: 800 mg via ORAL
  Filled 2019-11-10: qty 1

## 2019-11-10 NOTE — ED Triage Notes (Signed)
Had an abortion Tuesday. C/o severe vaginal bleeding and cramping since Friday. Passing large clots today.

## 2019-11-10 NOTE — Discharge Instructions (Addendum)
Please follow-up with OB/GYN.  You were given a dose of Cytotec here. Take ibuprofen 600mg  three times a day. Use Tylenol as needed additionally.

## 2019-11-10 NOTE — ED Provider Notes (Signed)
[facility] EMERGENCY DEPARTMENT Provider Note   CSN: [id number] Arrival date & time: 11/10/19  1110     History Chief Complaint  Patient presents with  . Vaginal Bleeding    Shawna Harris is a 43 y.o. female.  HPI Patient is a 43 year old female with no past medical surgeries presented today for lower abdominal pain that is severe, 8/10, nonradiating, achy, nonpositional and crampy that started 2 days ago in the evening is progressively worsened.  Patient had dilation and evacuation abortion conducted 12/23 and and was asymptomatic for 2 days before pain began.  Patient states she began experiencing profuse vaginal bleeding today with large clots.  States she took 1x 800 mg ibuprofen with some improvement in pain.  Has not taken any other medications.  Patient states she felt slightly lightheaded when she was bleeding denies any current lightheadedness.   Denies any fevers, nausea vomiting, headache, dizziness.        Past Medical History:  Diagnosis Date  . Medical history non-contributory     Patient Active Problem List   Diagnosis Date Noted  . [redacted] weeks gestation of pregnancy   . Abdominal pain in pregnancy, antepartum   . No prenatal care in current pregnancy     Past Surgical History:  Procedure Laterality Date  . NO PAST SURGERIES       OB History    Gravida  6   Para  4   Term  4   Preterm      AB  1   Living  4     SAB      TAB  1   Ectopic      Multiple      Live Births              No family history on file.  Social History   Tobacco Use  . Smoking status: Current Every Day Smoker    Packs/day: 0.50    Types: Cigarettes  . Smokeless tobacco: Never Used  Substance Use Topics  . Alcohol use: Yes    Comment: socially   . Drug use: No    Home Medications Prior to Admission medications   Medication Sig Start Date End Date Taking? Authorizing Provider  amLODipine (NORVASC) 5 MG tablet Take 1 tablet (5 mg  total) by mouth daily. 01/27/19   Little, Ambrose Finland, MD  amoxicillin (AMOXIL) 500 MG capsule Take 1 capsule (500 mg total) by mouth 3 (three) times daily. 11/20/16   Fayrene Helper, PA-C  aspirin 81 MG chewable tablet Chew 1 tablet (81 mg total) by mouth daily. 03/15/15   Burleson, Brand Males, NP  ibuprofen (ADVIL,MOTRIN) 800 MG tablet Take 1 tablet (800 mg total) by mouth 3 (three) times daily. 11/20/16   Fayrene Helper, PA-C  labetalol (NORMODYNE) 200 MG tablet Take 1 tablet (200 mg total) by mouth 2 (two) times daily. 03/15/15   Currie Paris, NP  Multiple Vitamin (MULTIVITAMIN) tablet Take 1 tablet by mouth daily.    [provider]  nicotine (NICODERM CQ - DOSED IN MG/24 HOURS) 21 mg/24hr patch Place 21 mg onto the skin daily.    [provider]  Prenatal Vit-Fe Fumarate-FA (PRENATAL VITAMINS PLUS) 27-1 MG TABS Take 1 tablet by mouth daily. Generic is OK - Prenatal vitamin with 1 mg Folic acid 03/15/15   Burleson, Brand Males, NP    Allergies    Patient has no known allergies.  Review of Systems   Review  of Systems  Constitutional: Negative for chills and fever.  HENT: Negative for congestion.   Eyes: Negative for pain.  Respiratory: Negative for cough and shortness of breath.   Cardiovascular: Negative for chest pain and leg swelling.  Gastrointestinal: Positive for abdominal pain. Negative for vomiting.  Genitourinary: Positive for vaginal bleeding. Negative for dysuria.  Musculoskeletal: Negative for myalgias.  Skin: Negative for rash.  Neurological: Negative for dizziness and headaches.    Physical Exam Updated Vital Signs BP (!) 148/91   Pulse 82   Temp 98.3 F (36.8 C) (Oral)   Resp 18   Ht  (1.651 m)   Wt 104.3 kg   SpO2 100%   BMI 38.27 kg/m   Physical Exam Vitals and nursing note reviewed.  Constitutional:      General: She is not in acute distress. HENT:     Head: Normocephalic and atraumatic.     Nose: Nose normal.  Eyes:     General: No scleral  icterus. Cardiovascular:     Rate and Rhythm: Normal rate and regular rhythm.     Pulses: Normal pulses.     Heart sounds: Normal heart sounds.  Pulmonary:     Effort: Pulmonary effort is normal. No respiratory distress.     Breath sounds: No wheezing.  Abdominal:     Palpations: Abdomen is soft.     Tenderness: There is abdominal tenderness (Lower abdominal TTP). There is no right CVA tenderness, left CVA tenderness, guarding or rebound.  Genitourinary:    Comments: Vulva without lesions or abnormality Vaginal canal without abnormal discharge or lesion. Blood pooling in the vaginal canal. Unable to visualize cervix os.  Cervical tissue does not appear erythematous or edematous General discomfort with exam. No distinct adnexal tenderness or CMT  Musculoskeletal:     Cervical back: Normal range of motion.     Right lower leg: No edema.     Left lower leg: No edema.  Skin:    General: Skin is warm and dry.     Capillary Refill: Capillary refill takes less than 2 seconds.  Neurological:     Mental Status: She is alert. Mental status is at baseline.  Psychiatric:        Mood and Affect: Mood normal.        Behavior: Behavior normal.     ED Results / Procedures / Treatments   Labs (all labs ordered are listed, but only abnormal results are displayed) Labs Reviewed  WET PREP, GENITAL - Abnormal; Notable for the following components:      Result Value   WBC, Wet Prep HPF POC FEW (*)    All other components within normal limits  PREGNANCY, URINE - Abnormal; Notable for the following components:   Preg Test, Ur POSITIVE (*)    All other components within normal limits  CBC WITH DIFFERENTIAL/PLATELET - Abnormal; Notable for the following components:   WBC 12.1 (*)    Hemoglobin 11.6 (*)    HCT 35.0 (*)    Monocytes Absolute 1.2 (*)    Abs Immature Granulocytes 0.08 (*)    All other components within normal limits  BASIC METABOLIC PANEL - Abnormal; Notable for the following  components:   Sodium 134 (*)    CO2 20 (*)    Glucose, Bld 147 (*)    All other components within normal limits  HCG, QUANTITATIVE, PREGNANCY - Abnormal; Notable for the following components:   hCG, Beta Chain, Quant, S 918 (*)  All other components within normal limits  URINALYSIS, ROUTINE W REFLEX MICROSCOPIC - Abnormal; Notable for the following components:   APPearance CLOUDY (*)    Hgb urine dipstick LARGE (*)    All other components within normal limits  URINALYSIS, MICROSCOPIC (REFLEX) - Abnormal; Notable for the following components:   Bacteria, UA RARE (*)    All other components within normal limits  GC/CHLAMYDIA PROBE AMP ([place]) NOT AT [facility]    EKG None   Radiology US OB Transvaginal  Result Date: 11/10/2019 CLINICAL DATA:  Status post therapeutic abortion 4 days ago. Vaginal bleeding and clots. EXAM: TRANSVAGINAL OB ULTRASOUND TECHNIQUE: Transvaginal ultrasound was performed for complete evaluation of the gestation as well as the maternal uterus, adnexal regions, and pelvic cul-de-sac. COMPARISON:  None for this pregnancy. FINDINGS: No intrauterine pregnancy is identified. Heterogeneous hyperechoic material is present within the endometrial canal measuring up to 3 cm. There is hyperemia within the myometrium. No increased flow is present within the material within the endometrial canal. Uterus and adnexa are otherwise within normal limits. Trace free fluid is present. IMPRESSION: 1. Heterogeneous material within the endometrial canal most consistent with blood clots. 2. No significant flow to the material within the endometrial canal to suggest retained products of conception. Electronically Signed   By: San Morelle M.D.   On: 11/10/2019 14:35     Procedures Procedures (including critical care time)   Medications Ordered in ED Medications  ibuprofen (ADVIL) tablet 800 mg (800 mg Oral Given 11/10/19 1237)  sodium chloride 0.9 % bolus 1,000 mL (0 mLs  Intravenous Stopped 11/10/19 1413)  misoprostol (CYTOTEC) tablet 600 mcg (600 mcg Oral Given 11/10/19 1524)    ED Course  I have reviewed the triage vital signs and the nursing notes.  Pertinent labs & imaging results that were available during my care of the patient were reviewed by me and considered in my medical decision making (see chart for details).  Clinical Course as of Nov 09 1932  Sun Nov 10, 2019  1930 [place] [address]  hCG, quantitative, pregnancy(!) [WF]    Clinical Course User Index [WF] Tedd Sias, [place]   MDM Rules/Calculators/A&P                      Patient is 43 year old female presented today after abortion 12/23 with vaginal bleeding that began this morning also with some lower abdominal pain.   Called GYN faculty practice provider to discuss case.  Recommended transvaginal OB ultrasound.  US showed: No evidence of retained products of conception.  There appear to be clots and the uterus consistent with what patient is experiencing. IMPRESSION: 1. Heterogeneous material within the endometrial canal most consistent with blood clots. 2. No significant flow to the material within the endometrial canal to suggest retained products of conception.  Urinalysis unremarkable. Rare bacteria.  No evidence of trichomonas or BV on wet prep.  BMP largely unremarkable. CBC with very mild anemia 11.6; mild leukocytosis 12.1 likely nonspecific or due to recent abortion and pain.  Discussed case with Dr. Sheran Lawless with OB/GYN faculty practice.  He recommends Cytotec 601 dose and follow-up in clinic.  Dr. Hoyt Koch will include his information for practice follow-up.  I recommended patient call Monday to make an appointment.  She states she will do so.  Will use Tylenol or Profen the meantime for pain.  Patient given strict return precautions.  At time of discharge patient states that her bleeding is from medically improved.  Discussed with her that she may experience additional bleeding  this is to be expected.  Gave her return precautions to include syncope chest pain shortness of breath or heart palpitations.  Or other new or concerning symptoms.  Vitals are within normal is here she is not hypotensive at any time she is not tachycardic either.    This patient appears reasonably screened and I doubt any other medical condition requiring further workup, evaluation, or treatment in the ED at this time prior to discharge.   Patient's vitals are WNL apart from vital sign abnormalities discussed above, patient is in NAD, and able to ambulate in the ED at their baseline. Pain has been managed or a plan has been made for home management and has no complaints prior to discharge. Patient is comfortable with above plan and is stable for discharge at this time. All questions were answered prior to disposition. Results from the ER workup discussed with the patient face to face and all questions answered to the best of my ability. The patient is safe for discharge with strict return precautions. Patient appears safe for discharge with appropriate follow-up. Conveyed my impression with the patient and they voiced understanding and are agreeable to plan.   An After Visit Summary was printed and given to the patient.  Portions of this note were generated with Scientist, clinical (histocompatibility and immunogenetics)Dragon dictation software. Dictation errors may occur despite best attempts at proofreading.     Final Clinical Impression(s) / ED Diagnoses Final diagnoses:  Vaginal bleeding    Rx / DC Orders ED Discharge Orders    None       Gailen ShelterFondaw, Iliana Hutt S, [place] 11/10/19 1933    Melene PlanFloyd, Dan, DO 11/12/19 [phone]

## 2019-11-12 LAB — GC/CHLAMYDIA PROBE AMP (~~LOC~~) NOT AT ARMC
Chlamydia: NEGATIVE
Neisseria Gonorrhea: NEGATIVE

## 2019-11-25 ENCOUNTER — Other Ambulatory Visit: Payer: Self-pay

## 2019-11-25 ENCOUNTER — Ambulatory Visit (INDEPENDENT_AMBULATORY_CARE_PROVIDER_SITE_OTHER): Payer: 59 | Admitting: Student

## 2019-11-25 ENCOUNTER — Encounter: Payer: Self-pay | Admitting: Student

## 2019-11-25 VITALS — BP 148/96 | HR 103 | Wt 221.2 lb

## 2019-11-25 DIAGNOSIS — Z9889 Other specified postprocedural states: Secondary | ICD-10-CM | POA: Diagnosis not present

## 2019-11-25 DIAGNOSIS — Z332 Encounter for elective termination of pregnancy: Secondary | ICD-10-CM

## 2019-11-25 NOTE — Patient Instructions (Signed)
Natural Family Planning  Natural Family Planning (NFP) is a type of birth control (contraception) in which no form of contraceptive medicine or device is used. The NFP method relies on knowing which days of the month a woman's ovary is producing an egg (ovulation). Ovulation is the time in the menstrual cycle when a woman is most fertile and, therefore, most likely to become pregnant. To lower the chance of pregnancy, sex is avoided during ovulation. NFP is a safe method of birth control and can prevent pregnancy if it is done correctly. However, NFP does not provide protection from sexually transmitted diseases. NFP can also be used as a method of getting pregnant, by deciding to have sex during ovulation. How does the NFP method work? NFP works by making both sexual partners aware of how the woman's body functions during her menstrual cycle.  Usually, a woman has a menstrual period every 28-30 days. However, there can be 23-35 days between each menstrual period. This varies for each woman. A woman with a 28-day menstrual cycle has about 6 days a month when she is most likely to get pregnant.  Ovulation happens 12-14 days before the start of the next menstrual period. There are different methods that are used to determine when ovulation starts.  An egg is fertile for 24 hours after it is released from the ovary. Sperm can live for 3 days or more. What NFP methods can be used to prevent pregnancy? The basal body temperature method During ovulation, there is often a slight increase in body temperature. To use this method:  Take your temperature every morning before getting out of bed. Write the temperature on a chart.  Do not have sex from the day the menstrual periods starts until 3 days after the increase in temperature. Note that body temperature may increase as a result of various factors, including fever, restless sleep, and working schedules. The cervical mucus method Right before  ovulation, mucus from the lower part of the uterus (cervix) changes from dry and sticky to wet and slippery. To use this method:  Check the mucus every day to look for these changes. Ovulation happens on the last day of wet, slippery mucus.  Do not have sex starting when you first see wet, slippery mucus and until 4 days after it stops or returns to its normal consistency.  With this method, it is safe to have sex: ? After the 4 days have passed, and until 10 days after the menstrual period starts. ? On days when mucus is dry. Note that cervical mucus can increase or change consistency due to reasons other than ovulation, such as infection, lubricants, some medicines, and sexual arousal. Other variations of the cervical mucus method include the TwoDay method, [place] ovulation method, and the [employer]. The symptothermal method This method combines the basal body temperature and the cervical mucus methods. The calendar method This method involves tracking menstrual cycles to determine when ovulation occurs. This method is helpful when the menstrual cycle varies in length. To use this method:  For 6 months, record when menstrual periods start and end and the length of each menstrual cycle. The length of a menstrual cycle is from day 1 of the present menstrual period to day 1 of the next menstrual period.  Use this information to determine when you will likely ovulate. Avoid sex during that time. You may need help from your health care provider to determine which days you are most likely to get pregnant. ?  Ovulation usually happens 12-14 days before the start of the next menstrual period. ? Light vaginal bleeding (spotting) or abdominal cramps during the middle of a menstrual cycle may be signs of ovulation. However, not all women have these symptoms. The standard days method This method is based on studies of women's hormone levels throughout normal menstrual cycles. Based on these  studies, a standard rule was developed to predict when women are most fertile during their menstrual cycle. According to the rule, if your cycle is 26-32 days long, you are most fertile between days 8 and 19. To prevent pregnancy, you should avoid having sex during this time, or use a barrier method of birth control. This method works best if your cycle is regularly between 26-32 days long. When should I not use the NFP method? You should not use NFP if:  You have very irregular menstrual periods or you sometimes skip a menstrual period.  You have abnormal vaginal bleeding.  You recently had a baby or are breastfeeding.  You have a vaginal or cervical infection.  You take medicines that can affect vaginal mucus or body temperature. These medicines include antibiotics, thyroid medicines, and antihistamines that are found in some cold and allergy medicines.  You absolutely do not want to become pregnant at the current time. Other methods of contraception are more effective at preventing pregnancy than NFP.  You are concerned about sexually transmitted diseases. Summary  Natural Family Planning methods help women and their partners to understand how to avoid pregnancy without using medicines or other methods.  A woman learns to recognize her most fertile days. A woman with a 28 day menstrual cycle has about 6 days per month when she can get pregnant. This information is not intended to replace advice given to you by your health care provider. Make sure you discuss any questions you have with your health care provider. Document Revised: 02/22/2019 Document Reviewed: 12/19/2016 Elsevier Patient Education  2020 [employer]. Laparoscopic Tubal Ligation Laparoscopic tubal ligation is a procedure to close the fallopian tubes. This is done so that you cannot get pregnant. When the fallopian tubes are closed, the eggs that your ovaries release cannot enter the uterus, and sperm cannot reach the  released eggs. You should not have this procedure if you want to get pregnant someday or if you are unsure about having more children. Tell a health care provider about:  Any allergies you have.  All medicines you are taking, including vitamins, herbs, eye drops, creams, and over-the-counter medicines.  Any problems you or family members have had with anesthetic medicines.  Any blood disorders you have.  Any surgeries you have had.  Any medical conditions you have.  Whether you are pregnant or may be pregnant.  Any past pregnancies. What are the risks? Generally, this is a safe procedure. However, problems may occur, including:  Infection.  Bleeding.  Injury to other organs in the abdomen.  Side effects from anesthetic medicines.  Failure of the procedure. This procedure can increase your risk of a kind of pregnancy in which a fertilized egg attaches to the outside of the uterus (ectopic pregnancy). What happens before the procedure? Medicines  Ask your health care provider about: ? Changing or stopping your regular medicines. This is especially important if you are taking diabetes medicines or blood thinners. ? Taking medicines such as aspirin and ibuprofen. These medicines can thin your blood. Do not take these medicines unless your health care provider tells you to take  them. ? Taking over-the-counter medicines, vitamins, herbs, and supplements. Staying hydrated  Follow instructions from your health care provider about hydration, which may include: ? Up to 2 hours before the procedure - you may continue to drink clear liquids, such as water, clear fruit juice, black coffee, and plain tea. Eating and drinking  Follow instructions from your health care provider about eating and drinking, which may include: ? 8 hours before the procedure - stop eating heavy meals or foods, such as meat, fried foods, or fatty foods. ? 6 hours before the procedure - stop eating light  meals or foods, such as toast or cereal. ? 6 hours before the procedure - stop drinking milk or drinks that contain milk. ? 2 hours before the procedure - stop drinking clear liquids. General instructions  Do not use any products that contain nicotine or tobacco for at least 4 weeks before the procedure. These products include cigarettes, e-cigarettes, and chewing tobacco. If you need help quitting, ask your health care provider.  Plan to have someone take you home from the hospital.  If you will be going home right after the procedure, plan to have someone with you for 24 hours.  Ask your health care provider: ? How your surgery site will be marked. ? What steps will be taken to help prevent infection. These may include:  Removing hair at the surgery site.  Washing skin with a germ-killing soap.  Taking antibiotic medicine. What happens during the procedure?      An IV will be inserted into one of your veins.  You will be given one or more of the following: ? A medicine to help you relax (sedative). ? A medicine to numb the area (local anesthetic). ? A medicine to make you fall asleep (general anesthetic). ? A medicine that is injected into an area of your body to numb everything below the injection site (regional anesthetic).  Your bladder may be emptied with a small tube (catheter).  If you have been given a general anesthetic, a tube will be put down your throat to help you breathe.  Two small incisions will be made in your lower abdomen and near your belly button.  Your abdomen will be inflated with a gas. This will let the surgeon see better and will give the surgeon room to work.  A thin, lighted tube (laparoscope) with a camera attached will be inserted into your abdomen through one of the incisions. Small instruments will be inserted through the other incision.  The fallopian tubes will be tied off, burned (cauterized), or blocked with a clip, ring, or clamp. A  small portion in the center of each fallopian tube may be removed.  The gas will be released from the abdomen.  The incisions will be closed with stitches (sutures).  A bandage (dressing) will be placed over the incisions. The procedure may vary among health care providers and hospitals. What happens after the procedure?  Your blood pressure, heart rate, breathing rate, and blood oxygen level will be monitored until you leave the hospital.  You will be given medicine to help with pain, nausea, and vomiting as needed. Summary  Laparoscopic tubal ligation is a procedure that is done so that you cannot get pregnant.  You should not have this procedure if you want to get pregnant someday or if you are unsure about having more children.  The procedure is done using a thin, lighted tube (laparoscope) with a camera attached that will be inserted  into your abdomen through an incision.  Follow instructions from your health care provider about eating and drinking before the procedure. This information is not intended to replace advice given to you by your health care provider. Make sure you discuss any questions you have with your health care provider. Document Revised: 04/09/2019 Document Reviewed: 09/25/2018 Elsevier Patient Education  2020 [employer].

## 2019-11-25 NOTE — Progress Notes (Signed)
History:  Shawna Harris is a 44 y.o. [id number] who presents to clinic today for follow up. Patient had a surgical TAB on 12/23 at [redacted] wks gestation. She presented to the emergency department on 12/27 for vaginal bleeding. HCG on that date was 918 & ultrasound shows thickened endometrium & blood products with no vascularity. Patient was discharged with cytotec. Reports no big bleed since taking cytotec.  Bleeding has decreased since then. Denies abdominal pain. No fever/chills.  Patient is ultimately interested in a BTL for contraception.    Patient Active Problem List   Diagnosis Date Noted  . [redacted] weeks gestation of pregnancy   . Abdominal pain in pregnancy, antepartum   . No prenatal care in current pregnancy     No Known Allergies  Current Outpatient Medications on File Prior to Visit  Medication Sig Dispense Refill  . amLODipine (NORVASC) 5 MG tablet Take 1 tablet (5 mg total) by mouth daily. 30 tablet 0  . amoxicillin (AMOXIL) 500 MG capsule Take 1 capsule (500 mg total) by mouth 3 (three) times daily. (Patient not taking: Reported on 11/25/2019) 21 capsule 0  . aspirin 81 MG chewable tablet Chew 1 tablet (81 mg total) by mouth daily. (Patient not taking: Reported on 11/25/2019) 30 tablet 0  . ibuprofen (ADVIL,MOTRIN) 800 MG tablet Take 1 tablet (800 mg total) by mouth 3 (three) times daily. (Patient not taking: Reported on 11/25/2019) 21 tablet 0  . Multiple Vitamin (MULTIVITAMIN) tablet Take 1 tablet by mouth daily.    . nicotine (NICODERM CQ - DOSED IN MG/24 HOURS) 21 mg/24hr patch Place 21 mg onto the skin daily.    . Prenatal Vit-Fe Fumarate-FA (PRENATAL VITAMINS PLUS) 27-1 MG TABS Take 1 tablet by mouth daily. Generic is OK - Prenatal vitamin with 1 mg Folic acid 30 tablet 0   No current facility-administered medications on file prior to visit.     The following portions of the patient's history were reviewed and updated as appropriate: allergies, current medications, family  history, past medical history, social history, past surgical history and problem list.  Review of Systems:  Other than those mentioned in HPI all ROS negative   Objective:  Physical Exam BP (!) 148/96   Pulse (!) 103   Wt 221 lb 3.2 oz (100.3 kg)   BMI 36.81 kg/m  CONSTITUTIONAL: Well-developed, well-nourished female in no acute distress.  EYES: EOM intact, conjunctivae normal, no scleral icterus HEAD: Normocephalic, atraumatic.  RESPIRATORY: Effort normal, no problems with respiration noted. GASTROINTESTINAL:Soft, no distention noted.  No tenderness, rebound or guarding.  SKIN: Skin is warm and dry. Not diaphoretic. No erythema. No pallor. PSYCHIATRIC: Normal mood and affect. Normal behavior. Normal judgment and thought content.   Labs and Imaging No results found for this or any previous visit (from the past 24 hour(s)).   Assessment & Plan:  1. Therapeutic abortion in first trimester -UPT remains positive. Will obtain HCG today -will schedule follow up appointment with MD to discuss BTL. Unsure of scheduling process for BTL during pandemic. Discussed other contraception options in interim. Her preference is to not use any hormones. Discussed NFP & condoms.  - Beta hCG quant (ref lab)   Judeth Horn, NP 11/25/2019 8:18 PM

## 2019-11-26 LAB — POCT PREGNANCY, URINE: Preg Test, Ur: POSITIVE — AB

## 2019-11-26 LAB — BETA HCG QUANT (REF LAB): hCG Quant: 8 m[IU]/mL

## 2019-11-27 ENCOUNTER — Telehealth: Payer: Self-pay | Admitting: Student

## 2019-11-27 NOTE — Telephone Encounter (Signed)
Left voicemail for patient to call office regarding results. Mychart still pending. Needs to be notified of HCG. Already has follow up appointment scheduled.   Judeth Horn, NP

## 2019-12-13 ENCOUNTER — Telehealth: Payer: 59 | Admitting: Obstetrics & Gynecology

## 2019-12-13 ENCOUNTER — Telehealth: Payer: 59 | Admitting: Family Medicine

## 2019-12-13 ENCOUNTER — Other Ambulatory Visit: Payer: Self-pay

## 2019-12-13 ENCOUNTER — Encounter: Payer: Self-pay | Admitting: Family Medicine

## 2019-12-13 DIAGNOSIS — Z91199 Patient's noncompliance with other medical treatment and regimen due to unspecified reason: Secondary | ICD-10-CM

## 2019-12-13 DIAGNOSIS — Z5329 Procedure and treatment not carried out because of patient's decision for other reasons: Secondary | ICD-10-CM

## 2019-12-13 NOTE — Progress Notes (Signed)
Patient did not keep appointment today. She may call to reschedule.  

## 2019-12-13 NOTE — Progress Notes (Signed)
9:53A- Called pt for My Chart visit no answer, left VM that will call back in 10 to 15 minutes.     10:12a-2nd attempt, no answer, pt will need to reschedule.

## 2020-08-25 ENCOUNTER — Emergency Department (HOSPITAL_BASED_OUTPATIENT_CLINIC_OR_DEPARTMENT_OTHER): Payer: 59

## 2020-08-25 ENCOUNTER — Encounter (HOSPITAL_BASED_OUTPATIENT_CLINIC_OR_DEPARTMENT_OTHER): Payer: Self-pay | Admitting: *Deleted

## 2020-08-25 ENCOUNTER — Other Ambulatory Visit: Payer: Self-pay

## 2020-08-25 ENCOUNTER — Observation Stay (HOSPITAL_BASED_OUTPATIENT_CLINIC_OR_DEPARTMENT_OTHER)
Admission: EM | Admit: 2020-08-25 | Discharge: 2020-08-26 | Disposition: A | Discharge status: Home / Self Care | Payer: 59 | Attending: Internal Medicine | Admitting: Internal Medicine

## 2020-08-25 DIAGNOSIS — I10 Essential (primary) hypertension: Secondary | ICD-10-CM | POA: Diagnosis present

## 2020-08-25 DIAGNOSIS — Z7982 Long term (current) use of aspirin: Secondary | ICD-10-CM | POA: Diagnosis not present

## 2020-08-25 DIAGNOSIS — E119 Type 2 diabetes mellitus without complications: Secondary | ICD-10-CM | POA: Diagnosis not present

## 2020-08-25 DIAGNOSIS — Z79899 Other long term (current) drug therapy: Secondary | ICD-10-CM | POA: Insufficient documentation

## 2020-08-25 DIAGNOSIS — R0602 Shortness of breath: Secondary | ICD-10-CM | POA: Diagnosis present

## 2020-08-25 DIAGNOSIS — U071 COVID-19: Secondary | ICD-10-CM | POA: Diagnosis not present

## 2020-08-25 DIAGNOSIS — J069 Acute upper respiratory infection, unspecified: Secondary | ICD-10-CM | POA: Diagnosis present

## 2020-08-25 DIAGNOSIS — J9601 Acute respiratory failure with hypoxia: Secondary | ICD-10-CM | POA: Insufficient documentation

## 2020-08-25 DIAGNOSIS — F17210 Nicotine dependence, cigarettes, uncomplicated: Secondary | ICD-10-CM | POA: Insufficient documentation

## 2020-08-25 HISTORY — DX: Essential (primary) hypertension: I10

## 2020-08-25 HISTORY — DX: Type 2 diabetes mellitus without complications: E11.9

## 2020-08-25 LAB — RESPIRATORY PANEL BY RT PCR (FLU A&B, COVID)
Influenza A by PCR: NEGATIVE
Influenza B by PCR: NEGATIVE
SARS Coronavirus 2 by RT PCR: NEGATIVE

## 2020-08-25 LAB — PROCALCITONIN: Procalcitonin: <0.1 ng/mL

## 2020-08-25 LAB — COMPREHENSIVE METABOLIC PANEL
ALT: 43 U/L (ref 0–44)
AST: 43 U/L — ABNORMAL HIGH (ref 15–41)
Albumin: 3.3 g/dL — ABNORMAL LOW (ref 3.5–5.0)
Alkaline Phosphatase: 76 U/L (ref 38–126)
Anion gap: 10 (ref 5–15)
BUN: 9 mg/dL (ref 6–20)
CO2: 25 mmol/L (ref 22–32)
Calcium: 8.8 mg/dL — ABNORMAL LOW (ref 8.9–10.3)
Chloride: 100 mmol/L (ref 98–111)
Creatinine, Ser: 0.8 mg/dL (ref 0.44–1.00)
GFR, Estimated: >60 mL/min (ref >60)
Glucose, Bld: 254 mg/dL — ABNORMAL HIGH (ref 70–99)
Potassium: 3.4 mmol/L — ABNORMAL LOW (ref 3.5–5.1)
Sodium: 135 mmol/L (ref 135–145)
Total Bilirubin: 0.6 mg/dL (ref 0.3–1.2)
Total Protein: 7.2 g/dL (ref 6.5–8.1)

## 2020-08-25 LAB — FIBRINOGEN: Fibrinogen: 643 mg/dL — ABNORMAL HIGH (ref 210–475)

## 2020-08-25 LAB — TROPONIN I (HIGH SENSITIVITY): Troponin I (High Sensitivity): 3 ng/L (ref <18)

## 2020-08-25 LAB — LACTIC ACID, PLASMA: Lactic Acid, Venous: 2.4 mmol/L (ref 0.5–1.9)

## 2020-08-25 LAB — TRIGLYCERIDES: Triglycerides: 972 mg/dL — ABNORMAL HIGH

## 2020-08-25 LAB — D-DIMER, QUANTITATIVE: D-Dimer, Quant: 0.59 ug/mL-FEU — ABNORMAL HIGH (ref 0.00–0.50)

## 2020-08-25 LAB — FERRITIN: Ferritin: 114 ng/mL (ref 11–307)

## 2020-08-25 LAB — LACTATE DEHYDROGENASE: LDH: 241 U/L — ABNORMAL HIGH (ref 98–192)

## 2020-08-25 LAB — HIV ANTIBODY (ROUTINE TESTING W REFLEX): HIV Screen 4th Generation wRfx: NONREACTIVE

## 2020-08-25 MED ORDER — SODIUM CHLORIDE 0.9 % IV SOLN
200.0000 mg | Freq: Once | INTRAVENOUS | Status: DC
  Filled 2020-08-25: qty 40

## 2020-08-25 MED ORDER — SODIUM CHLORIDE 0.9 % IV SOLN
100.0000 mg | Freq: Once | INTRAVENOUS | Status: AC
  Administered 2020-08-25: 100 mg via INTRAVENOUS
  Filled 2020-08-25: qty 20

## 2020-08-25 MED ORDER — DEXAMETHASONE SODIUM PHOSPHATE 10 MG/ML IJ SOLN
6.0000 mg | INTRAMUSCULAR | Status: DC
  Administered 2020-08-25: 6 mg via INTRAVENOUS
  Filled 2020-08-25: qty 1

## 2020-08-25 MED ORDER — SODIUM CHLORIDE 0.9 % IV SOLN
100.0000 mg | Freq: Once | INTRAVENOUS | Status: AC
  Filled 2020-08-25: qty 20

## 2020-08-25 MED ORDER — SODIUM CHLORIDE 0.9 % IV SOLN
100.0000 mg | Freq: Every day | INTRAVENOUS | Status: DC
  Administered 2020-08-25: 100 mg via INTRAVENOUS

## 2020-08-25 MED ORDER — IOHEXOL 350 MG/ML SOLN
100.0000 mL | Freq: Once | INTRAVENOUS | Status: AC
  Administered 2020-08-25: 100 mL via INTRAVENOUS

## 2020-08-25 MED ORDER — SODIUM CHLORIDE 0.9 % IV SOLN: INTRAVENOUS | Status: DC | PRN

## 2020-08-25 MED ORDER — SODIUM CHLORIDE 0.9 % IV SOLN: 100.0000 mg | Freq: Every day | INTRAVENOUS | Status: DC

## 2020-08-25 NOTE — ED Provider Notes (Signed)
[facility] EMERGENCY DEPARTMENT Provider Note   CSN: [id number] Arrival date & time: 08/25/20  1914     History Chief Complaint  Patient presents with  . Shortness of Breath    covid    Shawna Harris is a 44 y.o. female.  The history is provided by the patient.  Shortness of Breath Severity:  Moderate Onset quality:  Gradual Timing:  Constant Progression:  Unchanged Chronicity:  New Context: URI (covid positive on 9/29. Worsenign sob and hypoxia over last two days with ambulation, Heart rate elevated)   Relieved by:  Nothing Worsened by:  Nothing Associated symptoms: chest pain, cough and fever   Associated symptoms: no abdominal pain, no ear pain, no rash, no sore throat and no vomiting        Past Medical History:  Diagnosis Date  . Medical history non-contributory     Patient Active Problem List   Diagnosis Date Noted  . [redacted] weeks gestation of pregnancy   . Abdominal pain in pregnancy, antepartum   . No prenatal care in current pregnancy     Past Surgical History:  Procedure Laterality Date  . NO PAST SURGERIES       OB History    Gravida  5   Para  4   Term  4   Preterm      AB  1   Living  4     SAB      TAB  1   Ectopic      Multiple      Live Births              History reviewed. No pertinent family history.  Social History   Tobacco Use  . Smoking status: Current Every Day Smoker    Packs/day: 0.50    Types: Cigarettes  . Smokeless tobacco: Never Used  Substance Use Topics  . Alcohol use: Yes    Comment: socially   . Drug use: No    Home Medications Prior to Admission medications   Medication Sig Start Date End Date Taking? Authorizing Provider  amLODipine (NORVASC) 5 MG tablet Take 1 tablet (5 mg total) by mouth daily. 01/27/19   Little, Ambrose Finland, MD  amoxicillin (AMOXIL) 500 MG capsule Take 1 capsule (500 mg total) by mouth 3 (three) times daily. Patient not taking: Reported on 11/25/2019  11/20/16   Fayrene Helper, PA-C  aspirin 81 MG chewable tablet Chew 1 tablet (81 mg total) by mouth daily. Patient not taking: Reported on 11/25/2019 03/15/15   Currie Paris, NP  ibuprofen (ADVIL,MOTRIN) 800 MG tablet Take 1 tablet (800 mg total) by mouth 3 (three) times daily. Patient not taking: Reported on 11/25/2019 11/20/16   Fayrene Helper, PA-C  Multiple Vitamin (MULTIVITAMIN) tablet Take 1 tablet by mouth daily.    [provider]  nicotine (NICODERM CQ - DOSED IN MG/24 HOURS) 21 mg/24hr patch Place 21 mg onto the skin daily.    [provider]  Prenatal Vit-Fe Fumarate-FA (PRENATAL VITAMINS PLUS) 27-1 MG TABS Take 1 tablet by mouth daily. Generic is OK - Prenatal vitamin with 1 mg Folic acid 03/15/15   Burleson, Brand Males, NP    Allergies    Patient has no known allergies.  Review of Systems   Review of Systems  Constitutional: Positive for fever. Negative for chills.  HENT: Negative for ear pain and sore throat.   Eyes: Negative for pain and visual disturbance.  Respiratory: Positive for cough  and shortness of breath.   Cardiovascular: Positive for chest pain. Negative for palpitations.  Gastrointestinal: Negative for abdominal pain and vomiting.  Genitourinary: Negative for dysuria and hematuria.  Musculoskeletal: Negative for arthralgias and back pain.  Skin: Negative for color change and rash.  Neurological: Negative for seizures and syncope.  All other systems reviewed and are negative.   Physical Exam Updated Vital Signs  ED Triage Vitals [08/25/20 1925]  Enc Vitals Group     BP 129/89     Pulse Rate (!) 106     Resp 20     Temp 98.6 F (37 C)     Temp Source Oral     SpO2 97 %     Weight 210 lb (95.3 kg)     Height 5\' 5"  (1.651 m)     Head Circumference      Peak Flow      Pain Score 0     Pain Loc      Pain Edu?      Excl. in GC?     Physical Exam Vitals and nursing note reviewed.  Constitutional:      General: She is not in acute distress.     Appearance: She is well-developed. She is not ill-appearing.  HENT:     Head: Normocephalic and atraumatic.     Mouth/Throat:     Mouth: Mucous membranes are moist.  Eyes:     Conjunctiva/sclera: Conjunctivae normal.     Pupils: Pupils are equal, round, and reactive to light.  Cardiovascular:     Rate and Rhythm: Regular rhythm. Tachycardia present.     Pulses: Normal pulses.     Heart sounds: Normal heart sounds. No murmur heard.   Pulmonary:     Effort: Pulmonary effort is normal. Tachypnea present. No respiratory distress.     Breath sounds: Normal breath sounds.     Comments: Coarse breath sounds throughout ' Abdominal:     Palpations: Abdomen is soft.     Tenderness: There is no abdominal tenderness.  Musculoskeletal:        General: Normal range of motion.     Cervical back: Neck supple.     Right lower leg: No edema.     Left lower leg: No edema.  Skin:    General: Skin is warm and dry.     Capillary Refill: Capillary refill takes less than 2 seconds.  Neurological:     General: No focal deficit present.     Mental Status: She is alert and oriented to person, place, and time.  Psychiatric:        Mood and Affect: Mood normal.     ED Results / Procedures / Treatments   Labs (all labs ordered are listed, but only abnormal results are displayed) Labs Reviewed  LACTIC ACID, PLASMA - Abnormal; Notable for the following components:      Result Value   Lactic Acid, Venous 2.4 (*)    All other components within normal limits  COMPREHENSIVE METABOLIC PANEL - Abnormal; Notable for the following components:   Potassium 3.4 (*)    Glucose, Bld 254 (*)    Calcium 8.8 (*)    Albumin 3.3 (*)    AST 43 (*)    All other components within normal limits  D-DIMER, QUANTITATIVE (NOT AT [facility]) - Abnormal; Notable for the following components:   D-Dimer, Quant 0.59 (*)    All other components within normal limits  LACTATE DEHYDROGENASE - Abnormal; Notable for  the following  components:   LDH 241 (*)    All other components within normal limits  TRIGLYCERIDES - Abnormal; Notable for the following components:   Triglycerides 972 (*)    All other components within normal limits  FIBRINOGEN - Abnormal; Notable for the following components:   Fibrinogen 643 (*)    All other components within normal limits  RESPIRATORY PANEL BY RT PCR (FLU A&B, COVID)  CULTURE, BLOOD (ROUTINE X 2)  CULTURE, BLOOD (ROUTINE X 2)  LACTIC ACID, PLASMA  PROCALCITONIN  FERRITIN  C-REACTIVE PROTEIN  HIV ANTIBODY (ROUTINE TESTING W REFLEX)  TROPONIN I (HIGH SENSITIVITY)    EKG EKG Interpretation  Date/Time:  Tuesday August 25 2020 19:24:02 EDT Ventricular Rate:  107 PR Interval:    QRS Duration: 74 QT Interval:  320 QTC Calculation: 427 R Axis:   44 Text Interpretation: Sinus tachycardia Borderline T wave abnormalities Baseline wander in lead(s) I III aVR aVL aVF V1 Confirmed by Virgina NorfolkAdam, Rosita Guzzetta [phone]([id number]) on 08/25/2020 7:54:49 PM   Radiology CT Angio Chest PE W and/or Wo Contrast  Result Date: 08/25/2020 CLINICAL DATA:  Dyspnea, tachycardia, hypoxia EXAM: CT ANGIOGRAPHY CHEST WITH CONTRAST TECHNIQUE: Multidetector CT imaging of the chest was performed using the standard protocol during bolus administration of intravenous contrast. A second bolus was required to adequately opacified the central pulmonary arteries. Multiplanar CT image reconstructions and MIPs were obtained to evaluate the vascular anatomy. CONTRAST:  175 cc OMNIPAQUE IOHEXOL 350 MG/ML SOLN COMPARISON:  None. FINDINGS: Cardiovascular: There is adequate opacification of the pulmonary arterial tree. There is no intraluminal filling defect to suggest acute pulmonary embolism. The central pulmonary arteries are of normal caliber. No significant coronary artery calcification. There is borderline cardiomegaly with mild to moderate left ventricular hypertrophy. No pericardial effusion. The thoracic aorta is unremarkable.  Mediastinum/Nodes: There is shotty mediastinal and bilateral hilar adenopathy without frank pathologic enlargement which may be reactive in nature, but is nonspecific. Thyroid unremarkable. Esophagus unremarkable. Lungs/Pleura: There is moderate diffuse, peripheral ground-glass pulmonary infiltrate noted within the lungs bilaterally, likely infectious or inflammatory in nature and typical of that seen in acute COVID-19 pneumonia. There is moderate parenchymal involvement. No pneumothorax or pleural effusion. The central airways are widely patent. Upper Abdomen: No acute abnormality. Musculoskeletal: No chest wall abnormality. No acute or significant osseous findings. Review of the MIP images confirms the above findings. IMPRESSION: No pulmonary embolism. Moderate ground-glass pulmonary infiltrates, likely infectious or inflammatory and typical of that seen in acute COVID-19 pneumonia. Borderline cardiomegaly with mild to moderate left ventricular hypertrophy noted. This would be better assessed with echocardiography if indicated. Electronically Signed   By: Helyn NumbersAshesh  Parikh MD   On: 08/25/2020 21:56   DG Chest Portable 1 View  Result Date: 08/25/2020 CLINICAL DATA:  History of COVID-19 positivity with shortness of breath EXAM: PORTABLE CHEST 1 VIEW COMPARISON:  01/29/2017 FINDINGS: Cardiac shadow is within normal limits. Lungs are well aerated bilaterally. Patchy airspace opacity is noted consistent with the given clinical history. No sizable effusion is noted. No bony abnormality is seen. IMPRESSION: Patchy airspace opacities consistent with the given clinical history. Electronically Signed   By: Alcide CleverMark  Lukens M.D.   On: 08/25/2020 20:05    Procedures .Critical Care Performed by: Virgina Norfolkuratolo, Antwan Bribiesca, DO Authorized by: Virgina Norfolkuratolo, Aryon Nham, DO   Critical care provider statement:    Critical care time (minutes):  35   Critical care was necessary to treat or prevent imminent or life-threatening deterioration of the  following conditions:  Respiratory failure   Critical care was time spent personally by me on the following activities:  Blood draw for specimens, development of treatment plan with patient or surrogate, discussions with primary provider, evaluation of patient's response to treatment, examination of patient, obtaining history from patient or surrogate, ordering and performing treatments and interventions, ordering and review of laboratory studies, ordering and review of radiographic studies, re-evaluation of patient's condition, pulse oximetry and review of old charts   I assumed direction of critical care for this patient from another provider in my specialty: no     (including critical care time)  Medications Ordered in ED Medications  dexamethasone (DECADRON) injection 6 mg (6 mg Intravenous Given 08/25/20 2053)  remdesivir 100 mg in sodium chloride 0.9 % 100 mL IVPB (100 mg Intravenous New Bag/Given 08/25/20 2212)  0.9 %  sodium chloride infusion ( Intravenous New Bag/Given 08/25/20 2051)  iohexol (OMNIPAQUE) 350 MG/ML injection 100 mL (100 mLs Intravenous Contrast Given 08/25/20 2126)  remdesivir 100 mg in sodium chloride 0.9 % 100 mL IVPB (0 mg Intravenous Stopped 08/25/20 2210)    Or  remdesivir 100 mg in sodium chloride 0.9 % 100 mL IVPB ( Intravenous See Alternative 08/25/20 2210)    ED Course  I have reviewed the triage vital signs and the nursing notes.  Pertinent labs & imaging results that were available during my care of the patient were reviewed by me and considered in my medical decision making (see chart for details).    MDM Rules/Calculators/A&P                          Shawna Harris is a 44 year old female who presents to the ED with shortness of breath, tachycardia, hypoxia.  Patient Covid positive about 2 weeks ago.  Was not vaccinated.  States that she has been tachycardic to the 130s and 150s at home.  States she has been hypoxic with ambulation down in the low  80s feel extremely short of breath and weak during that time.  She is a Engineer, civil (consulting).  She has normal room air oxygenation here.  She is tachycardic intermittently to the 120s.  Having pleuritic chest pain.  EKG shows sinus tachycardia.  No ischemic changes.  Covid test here is negative but suspect false negative.  She had a chest x-ray and CT scan of the chest that is consistent with Covid pneumonia.  Patient had no PE.  Troponin was normal.  No major signs of volume overload on exam.  Inflammatory markers elevated.  Overall suspect Covid pneumonia causing hypoxia, tachycardia.  Patient given IV remdesivir and IV Decadron and will admit for further care.  This chart was dictated using voice recognition software.  Despite best efforts to proofread,  errors can occur which can change the documentation meaning.  Shawna Harris was evaluated in Emergency Department on 08/25/2020 for the symptoms described in the history of present illness. She was evaluated in the context of the global COVID-19 pandemic, which necessitated consideration that the patient might be at risk for infection with the SARS-CoV-2 virus that causes COVID-19. Institutional protocols and algorithms that pertain to the evaluation of patients at risk for COVID-19 are in a state of rapid change based on information released by regulatory bodies including the CDC and federal and state organizations. These policies and algorithms were followed during the patient's care in the ED.     Final Clinical Impression(s) / ED Diagnoses Final diagnoses:  COVID-19  Acute respiratory failure with hypoxia [facility])    Rx / DC Orders ED Discharge Orders    None       Virgina Norfolk, DO 08/25/20 2246

## 2020-08-25 NOTE — ED Triage Notes (Signed)
Dx with covid 08/14/20, symptoms started on 08/12/20. Reports SOB, tachycardia started 3 days ago. Reports o2 of 80% and HR of 140 while ambulating at home. Pt in wheelchair in lobby.

## 2020-08-25 NOTE — ED Notes (Signed)
Presents with c/o feeling a fast heart beat, has some SOB when up walking around or trying to do work, also c/o pain at left ant chest. Pain increases with inspiration.

## 2020-08-25 NOTE — ED Notes (Signed)
BC x 1 Rt AC 40ml each BC x 2 Lt AC 19ml each

## 2020-08-25 NOTE — ED Notes (Signed)
Hospitalist repaged by [facility] per Babs Sciara.

## 2020-08-25 NOTE — ED Notes (Signed)
Consult for hospitalist completed, spoke with Tami.

## 2020-08-25 NOTE — ED Notes (Signed)
Date and time results received: 08/25/20 8:52 PM  (use smartphrase ".now" to insert current time)  Test: lactic acid Critical Value: 2.4  Name of Provider Notified: Dr. Lockie Mola  Orders Received? Or Actions Taken?: no new orders

## 2020-08-26 ENCOUNTER — Encounter (HOSPITAL_BASED_OUTPATIENT_CLINIC_OR_DEPARTMENT_OTHER): Payer: Self-pay | Admitting: Internal Medicine

## 2020-08-26 DIAGNOSIS — I10 Essential (primary) hypertension: Secondary | ICD-10-CM

## 2020-08-26 DIAGNOSIS — U071 COVID-19: Secondary | ICD-10-CM

## 2020-08-26 DIAGNOSIS — J9601 Acute respiratory failure with hypoxia: Secondary | ICD-10-CM

## 2020-08-26 DIAGNOSIS — F17210 Nicotine dependence, cigarettes, uncomplicated: Secondary | ICD-10-CM | POA: Diagnosis not present

## 2020-08-26 DIAGNOSIS — Z79899 Other long term (current) drug therapy: Secondary | ICD-10-CM | POA: Diagnosis not present

## 2020-08-26 DIAGNOSIS — Z7982 Long term (current) use of aspirin: Secondary | ICD-10-CM | POA: Diagnosis not present

## 2020-08-26 DIAGNOSIS — E119 Type 2 diabetes mellitus without complications: Secondary | ICD-10-CM | POA: Diagnosis not present

## 2020-08-26 DIAGNOSIS — R0602 Shortness of breath: Secondary | ICD-10-CM | POA: Diagnosis present

## 2020-08-26 DIAGNOSIS — J069 Acute upper respiratory infection, unspecified: Secondary | ICD-10-CM

## 2020-08-26 LAB — CBC WITH DIFFERENTIAL/PLATELET
Abs Immature Granulocytes: 0.05 10*3/uL (ref 0.00–0.07)
Basophils Absolute: 0 10*3/uL (ref 0.0–0.1)
Basophils Relative: 0 %
Eosinophils Absolute: 0 10*3/uL (ref 0.0–0.5)
Eosinophils Relative: 0 %
HCT: 42.8 % (ref 36.0–46.0)
Hemoglobin: 14 g/dL (ref 12.0–15.0)
Immature Granulocytes: 1 %
Lymphocytes Relative: 18 %
Lymphs Abs: 1.3 10*3/uL (ref 0.7–4.0)
MCH: 28.2 pg (ref 26.0–34.0)
MCHC: 32.7 g/dL (ref 30.0–36.0)
MCV: 86.1 fL (ref 80.0–100.0)
Monocytes Absolute: 0.3 10*3/uL (ref 0.1–1.0)
Monocytes Relative: 5 %
Neutro Abs: 5.6 10*3/uL (ref 1.7–7.7)
Neutrophils Relative %: 76 %
Platelets: 426 10*3/uL — ABNORMAL HIGH (ref 150–400)
RBC: 4.97 MIL/uL (ref 3.87–5.11)
RDW: 12.7 % (ref 11.5–15.5)
WBC: 7.3 10*3/uL (ref 4.0–10.5)
nRBC: 0 % (ref 0.0–0.2)

## 2020-08-26 LAB — BRAIN NATRIURETIC PEPTIDE: B Natriuretic Peptide: 24.3 pg/mL (ref 0.0–100.0)

## 2020-08-26 LAB — LACTIC ACID, PLASMA: Lactic Acid, Venous: 1.6 mmol/L (ref 0.5–1.9)

## 2020-08-26 LAB — GLUCOSE, CAPILLARY
Glucose-Capillary: 287 mg/dL — ABNORMAL HIGH (ref 70–99)
Glucose-Capillary: 257 mg/dL — ABNORMAL HIGH (ref 70–99)

## 2020-08-26 LAB — CBG MONITORING, ED: Glucose-Capillary: 259 mg/dL — ABNORMAL HIGH (ref 70–99)

## 2020-08-26 MED ORDER — GUAIFENESIN-DM 100-10 MG/5ML PO SYRP: 10.0000 mL | ORAL_SOLUTION | ORAL | Status: DC | PRN

## 2020-08-26 MED ORDER — ALBUTEROL SULFATE HFA 108 (90 BASE) MCG/ACT IN AERS
2.0000 | INHALATION_SPRAY | RESPIRATORY_TRACT | Status: DC | PRN
  Filled 2020-08-26: qty 6.7

## 2020-08-26 MED ORDER — HYDRALAZINE HCL 20 MG/ML IJ SOLN: 10.0000 mg | Freq: Four times a day (QID) | INTRAMUSCULAR | Status: DC | PRN

## 2020-08-26 MED ORDER — INSULIN ASPART 100 UNIT/ML ~~LOC~~ SOLN
0.0000 [IU] | Freq: Three times a day (TID) | SUBCUTANEOUS | Status: DC
  Administered 2020-08-26 (×2): 8 [IU] via SUBCUTANEOUS

## 2020-08-26 MED ORDER — ALBUTEROL SULFATE HFA 108 (90 BASE) MCG/ACT IN AERS: 2.0000 | INHALATION_SPRAY | RESPIRATORY_TRACT | 0 refills | Status: AC | PRN

## 2020-08-26 MED ORDER — ENOXAPARIN SODIUM 40 MG/0.4ML ~~LOC~~ SOLN
40.0000 mg | Freq: Every day | SUBCUTANEOUS | Status: DC
  Administered 2020-08-26: 40 mg via SUBCUTANEOUS
  Filled 2020-08-26: qty 0.4

## 2020-08-26 MED ORDER — DEXAMETHASONE 6 MG PO TABS: 6.0000 mg | ORAL_TABLET | Freq: Every day | ORAL | 0 refills | Status: AC

## 2020-08-26 MED ORDER — HYDROCOD POLST-CPM POLST ER 10-8 MG/5ML PO SUER: 5.0000 mL | Freq: Two times a day (BID) | ORAL | Status: DC | PRN

## 2020-08-26 MED ORDER — POLYETHYLENE GLYCOL 3350 17 G PO PACK
17.0000 g | PACK | Freq: Every day | ORAL | Status: DC | PRN
  Filled 2020-08-26: qty 1

## 2020-08-26 MED ORDER — ACETAMINOPHEN 325 MG PO TABS: 650.0000 mg | ORAL_TABLET | ORAL | Status: DC | PRN

## 2020-08-26 MED ORDER — ONDANSETRON HCL 4 MG/2ML IJ SOLN: 4.0000 mg | Freq: Four times a day (QID) | INTRAMUSCULAR | Status: DC | PRN

## 2020-08-26 MED ORDER — MELATONIN 5 MG PO TABS
10.0000 mg | ORAL_TABLET | Freq: Every evening | ORAL | Status: DC | PRN
  Filled 2020-08-26: qty 2

## 2020-08-26 MED ORDER — ENOXAPARIN SODIUM 60 MG/0.6ML ~~LOC~~ SOLN: 50.0000 mg | Freq: Every day | SUBCUTANEOUS | Status: DC

## 2020-08-26 MED ORDER — INSULIN ASPART 100 UNIT/ML ~~LOC~~ SOLN
0.0000 [IU] | Freq: Three times a day (TID) | SUBCUTANEOUS | Status: DC
  Administered 2020-08-26: 8 [IU] via SUBCUTANEOUS
  Filled 2020-08-26: qty 3

## 2020-08-26 MED ORDER — GUAIFENESIN-DM 100-10 MG/5ML PO SYRP: 10.0000 mL | ORAL_SOLUTION | ORAL | 0 refills | Status: AC | PRN

## 2020-08-26 NOTE — H&P (Signed)
History and Physical    TIM WILHIDE SJG:[id number] DOB: 17-Aug-1976 DOA: 08/25/2020  PCP: Fleet Contras, MD  Patient coming from: Home  I have personally briefly reviewed patient's old medical records in [facility]  Chief Complaint: SOB  HPI: Shawna Harris is a 44 y.o. female with medical history significant of HTN, DM2, prior smoking.  Pt is an [employer].  Pt diagnosed with COVID-19 on 10/1, positive test at [facility] (visible in care everywhere section of chart).  Pt with progressively worsening SOB, DOE, tachycardia with exertion for the past 3 days.  Now desats to 80% and HR of 140 with ambulating at home.  Symptoms worsening, severe, worse with exertion and better at rest.   ED Course: Satting 92% on RA at rest but desats into the 80s with exertion.  CTA chest is neg for PE, just shows COVID-19 PNA findings.  CBC wasn't actually obtained yet.  Procalcitonin neg.  D.Dimer 0.59  Started on remdesivir and decadron in ED and transferred for admission.   Review of Systems: As per HPI, otherwise all review of systems negative.  Past Medical History:  Diagnosis Date  . Diabetes mellitus without complication (HCC)   . Hypertension     Past Surgical History:  Procedure Laterality Date  . NO PAST SURGERIES       reports that she quit smoking 3 days ago. Her smoking use included cigarettes. She smoked 0.50 packs per day. She has never used smokeless tobacco. She reports current alcohol use. She reports that she does not use drugs.  No Known Allergies  History reviewed. No pertinent family history. No h/o clotting disorders reported  Prior to Admission medications   Medication Sig Start Date End Date Taking? Authorizing Provider  amLODipine (NORVASC) 5 MG tablet Take 1 tablet (5 mg total) by mouth daily. 01/27/19   Little, Ambrose Finland, MD  Multiple Vitamin (MULTIVITAMIN) tablet Take 1 tablet by mouth daily.    [provider]  nicotine (NICODERM CQ -  DOSED IN MG/24 HOURS) 21 mg/24hr patch Place 21 mg onto the skin daily.    [provider]  Prenatal Vit-Fe Fumarate-FA (PRENATAL VITAMINS PLUS) 27-1 MG TABS Take 1 tablet by mouth daily. Generic is OK - Prenatal vitamin with 1 mg Folic acid 03/15/15   Currie Paris, NP    Physical Exam: Vitals:   08/26/20 0100 08/26/20 0200 08/26/20 0300 08/26/20 0624  BP: (!) 136/98 139/79 124/71 134/83  Pulse: 95 (!) 106 95 99  Resp:  20 14 18   Temp:    97.6 F (36.4 C)  TempSrc:    Oral  SpO2: 96% 94% 91% 96%  Weight:    96.6 kg  Height:    5\' 5"  (1.651 m)    Constitutional: NAD, calm, comfortable Eyes: PERRL, lids and conjunctivae normal ENMT: Mucous membranes are moist. Posterior pharynx clear of any exudate or lesions.Normal dentition.  Neck: normal, supple, no masses, no thyromegaly Respiratory: clear to auscultation bilaterally, no wheezing, no crackles. Normal respiratory effort. No accessory muscle use.  Cardiovascular: Regular rate and rhythm, no murmurs / rubs / gallops. No extremity edema. 2+ pedal pulses. No carotid bruits.  Abdomen: no tenderness, no masses palpated. No hepatosplenomegaly. Bowel sounds positive.  Musculoskeletal: no clubbing / cyanosis. No joint deformity upper and lower extremities. Good ROM, no contractures. Normal muscle tone.  Skin: no rashes, lesions, ulcers. No induration Neurologic: CN 2-12 grossly intact. Sensation intact, DTR normal. Strength 5/5 in all 4.  Psychiatric:  Normal judgment and insight. Alert and oriented x 3. Normal mood.    Labs on Admission: I have personally reviewed following labs and imaging studies  CBC: No results for input(s): WBC, NEUTROABS, HGB, HCT, MCV, PLT in the last 168 hours. Basic Metabolic Panel: Recent Labs  Lab 08/25/20 2000  NA 135  K 3.4*  CL 100  CO2 25  GLUCOSE 254*  BUN 9  CREATININE 0.80  CALCIUM 8.8*   GFR: Estimated Creatinine Clearance: 104.2 mL/min (by C-G formula based on SCr of 0.8  mg/dL). Liver Function Tests: Recent Labs  Lab 08/25/20 2000  AST 43*  ALT 43  ALKPHOS 76  BILITOT 0.6  PROT 7.2  ALBUMIN 3.3*   No results for input(s): LIPASE, AMYLASE in the last 168 hours. No results for input(s): AMMONIA in the last 168 hours. Coagulation Profile: No results for input(s): INR, PROTIME in the last 168 hours. Cardiac Enzymes: No results for input(s): CKTOTAL, CKMB, CKMBINDEX, TROPONINI in the last 168 hours. BNP (last 3 results) No results for input(s): PROBNP in the last 8760 hours. HbA1C: No results for input(s): HGBA1C in the last 72 hours. CBG: Recent Labs  Lab 08/26/20 0039  GLUCAP 259*   Lipid Profile: Recent Labs    08/25/20 2000  TRIG 972*   Thyroid Function Tests: No results for input(s): TSH, T4TOTAL, FREET4, T3FREE, THYROIDAB in the last 72 hours. Anemia Panel: Recent Labs    08/25/20 2045  FERRITIN 114   Urine analysis:    Component Value Date/Time   COLORURINE YELLOW 11/10/2019 1314   APPEARANCEUR CLOUDY (A) 11/10/2019 1314   LABSPEC 1.020 11/10/2019 1314   PHURINE 6.0 11/10/2019 1314   GLUCOSEU NEGATIVE 11/10/2019 1314   HGBUR LARGE (A) 11/10/2019 1314   BILIRUBINUR NEGATIVE 11/10/2019 1314   KETONESUR NEGATIVE 11/10/2019 1314   PROTEINUR NEGATIVE 11/10/2019 1314   UROBILINOGEN 1.0 03/15/2015 1415   NITRITE NEGATIVE 11/10/2019 1314   LEUKOCYTESUR NEGATIVE 11/10/2019 1314    Radiological Exams on Admission: CT Angio Chest PE W and/or Wo Contrast  Result Date: 08/25/2020 CLINICAL DATA:  Dyspnea, tachycardia, hypoxia EXAM: CT ANGIOGRAPHY CHEST WITH CONTRAST TECHNIQUE: Multidetector CT imaging of the chest was performed using the standard protocol during bolus administration of intravenous contrast. A second bolus was required to adequately opacified the central pulmonary arteries. Multiplanar CT image reconstructions and MIPs were obtained to evaluate the vascular anatomy. CONTRAST:  175 cc OMNIPAQUE IOHEXOL 350 MG/ML SOLN  COMPARISON:  None. FINDINGS: Cardiovascular: There is adequate opacification of the pulmonary arterial tree. There is no intraluminal filling defect to suggest acute pulmonary embolism. The central pulmonary arteries are of normal caliber. No significant coronary artery calcification. There is borderline cardiomegaly with mild to moderate left ventricular hypertrophy. No pericardial effusion. The thoracic aorta is unremarkable. Mediastinum/Nodes: There is shotty mediastinal and bilateral hilar adenopathy without frank pathologic enlargement which may be reactive in nature, but is nonspecific. Thyroid unremarkable. Esophagus unremarkable. Lungs/Pleura: There is moderate diffuse, peripheral ground-glass pulmonary infiltrate noted within the lungs bilaterally, likely infectious or inflammatory in nature and typical of that seen in acute COVID-19 pneumonia. There is moderate parenchymal involvement. No pneumothorax or pleural effusion. The central airways are widely patent. Upper Abdomen: No acute abnormality. Musculoskeletal: No chest wall abnormality. No acute or significant osseous findings. Review of the MIP images confirms the above findings. IMPRESSION: No pulmonary embolism. Moderate ground-glass pulmonary infiltrates, likely infectious or inflammatory and typical of that seen in acute COVID-19 pneumonia. Borderline cardiomegaly with mild to  moderate left ventricular hypertrophy noted. This would be better assessed with echocardiography if indicated. Electronically Signed   By: Helyn Numbers MD   On: 08/25/2020 21:56   DG Chest Portable 1 View  Result Date: 08/25/2020 CLINICAL DATA:  History of COVID-19 positivity with shortness of breath EXAM: PORTABLE CHEST 1 VIEW COMPARISON:  01/29/2017 FINDINGS: Cardiac shadow is within normal limits. Lungs are well aerated bilaterally. Patchy airspace opacity is noted consistent with the given clinical history. No sizable effusion is noted. No bony abnormality is seen.  IMPRESSION: Patchy airspace opacities consistent with the given clinical history. Electronically Signed   By: Alcide Clever M.D.   On: 08/25/2020 20:05    EKG: Independently reviewed.  Assessment/Plan Principal Problem:   Acute respiratory disease due to COVID-19 virus Active Problems:   HTN (hypertension)   DM2 (diabetes mellitus, type 2) (HCC)    1. COVID-19 PNA with new O2 requirement with ambulation 1. COVID-19 pathway 2. Cont remdesivir 3. Cont decadron 4. barcitinib / actemra not indicated at this point (no O2 requirement at rest currently). 5. Cont pulse ox 6. Tele monitor 7. Check CBC 8. Daily labs 2. HTN - 1. Med rec pending 2. Cont home meds 3. Currently on PRN hydralazine if needed 3. DM2 - 1. Med rec pending 2. Currently on mod scale SSI AC  DVT prophylaxis: Lovenox Code Status: Full Family Communication: No family in room Disposition Plan: Home after able to ambulate off oxygen Consults called: None Admission status: Place in obs - though I think she will meet IP criteria based on the new O2 requirement.    Shawna Olvera Judie Petit DO [facility]  How to contact the [facility] Attending or Consulting provider 7A - 7P or covering provider during after hours 7P -7A, for this patient?  1. Check the care team in [facility] and look for a) attending/consulting [facility] provider listed and b) the [facility] team listed 2. Log into [email or web address]  Am[employer] Physician Scheduling and messaging for groups and whole hospitals  On call and physician scheduling software for group practices, residents, hospitalists and other medical providers for call, clinic, rotation and shift schedules. On[employer] is a hospital-wide system for scheduling doctors and paging doctors on call. EasyPlot is for scientific plotting and data analysis.  [email or web address]  and use [facility]'s universal password to access. If you do not have the password, please contact the hospital operator.  3. Locate the [facility] provider  you are looking for under [facility] and page to a number that you can be directly reached. 4. If you still have difficulty reaching the provider, please page the [facility] (Director on Call) for the Hospitalists listed on amion for assistance.  08/26/2020, 6:58 AM

## 2020-08-26 NOTE — Discharge Summary (Signed)
Physician Discharge Summary  Shawna Harris DQQ:[id number] DOB: Apr 18, 1976 DOA: 08/25/2020  PCP: Fleet Contras, MD  Admit date: 08/25/2020 Discharge date: 08/26/2020  Admitted From: Home Disposition: Home  Recommendations for Outpatient Follow-up:  1. Follow up with PCP in 1-2 weeks 2. Follow-up [facility] outpatient infusion clinic to complete remdesivir infusion 3. Continue Decadron 6 mg p.o. daily to complete a 10-day course 4. Albuterol MDI as needed for shortness of breath/wheezing 5. Encourage Covid-19 vaccination 45 days from initial diagnosis  Home Health: No Equipment/Devices: None  Discharge Condition: Stable CODE STATUS: Full code Diet recommendation: Heart healthy diet  History of present illness: Shawna Harris is a 44 year old female with past medical history notable for essential hypertension, type 2 diabetes mellitus and history of tobacco use disorder.  Patient was initially diagnosed with Covid-19 on 08/14/2020, positive test at [facility].  Patient continued with progressive shortness of breath, dyspnea on exertion with associated tachycardia for the previous 3 days.  Patient was noted to have oxygen saturation of 80% on room air on arrival to ED.  CT angiogram chest negative for PE but with findings consistent with Covid-19 viral pneumonia.  Patient was started on remdesivir and Decadron.  Hospitalist service consulted for admission for acute hypoxic respiratory failure secondary to Covid-19 viral pneumonia.   Hospital course:  Acute hypoxic respiratory failure secondary to acute Covid-19 viral pneumonia during the ongoing 2020 Covid 19 Pandemic - POA Patient presenting to the ED with progressive shortness of breath, dyspnea on exertion.  Initially diagnosed with [facility] on 08/14/2020 at [facility].  Was found to be hypoxic with exertion on presentation with SPO2 80%.  Patient was admitted under observation and started on treatment with remdesivir and Decadron.  Patient  symptoms rapidly improved with no further desaturation.  Patient was titrated off of supplemental oxygen even with exertion with no further desaturations noted.  Patient states ready for discharge home and okay to complete remdesivir infusion outpatient.  Outpatient infusion clinic contacted and will set up outpatient remdesivir infusions for 10/14, 10/15, and 10/16.  Patient will continue Decadron 6 mg p.o. daily to complete a 10-day course.  Also patient given a prescription for albuterol MDI to use as needed for shortness of breath/wheezing.  Continue home isolation for 14 days from initial diagnosis.  Encourage Covid-19 vaccination 45 days from initial diagnosis.  The treatment plan and use of medications and known side effects were discussed with patient/family. Some of the medications used are based on case reports/anecdotal data.  All other medications being used in the management of COVID-19 based on limited study data.  Complete risks and long-term side effects are unknown, however in the best clinical judgment they seem to be of some benefit.  Patient wanted to proceed with treatment options provided.  Essential hypertension  Continue amlodipine 5 mg p.o. daily.  Discharge Diagnoses:  Principal Problem:   Acute respiratory disease due to COVID-19 virus Active Problems:   HTN (hypertension)   DM2 (diabetes mellitus, type 2) [facility])    Discharge Instructions  Discharge Instructions    Call MD for:  difficulty breathing, headache or visual disturbances   Complete by: As directed    Call MD for:  extreme fatigue   Complete by: As directed    Call MD for:  persistant dizziness or light-headedness   Complete by: As directed    Call MD for:  persistant nausea and vomiting   Complete by: As directed    Call MD for:  severe uncontrolled  pain   Complete by: As directed    Call MD for:  temperature >100.4   Complete by: As directed    Diet - low sodium heart healthy   Complete by: As  directed    Increase activity slowly   Complete by: As directed      Allergies as of 08/26/2020   No Known Allergies     Medication List    STOP taking these medications   PreNatal Vitamins Plus 27-1 MG Tabs     TAKE these medications   albuterol 108 (90 Base) MCG/ACT inhaler Commonly known as: VENTOLIN HFA Inhale 2 puffs into the lungs every 4 (four) hours as needed for wheezing or shortness of breath.   amLODipine 5 MG tablet Commonly known as: NORVASC Take 1 tablet (5 mg total) by mouth daily.   dexamethasone 6 MG tablet Commonly known as: DECADRON Take 1 tablet (6 mg total) by mouth daily for 7 days. Start taking on: August 27, 2020   guaiFENesin-dextromethorphan 100-10 MG/5ML syrup Commonly known as: ROBITUSSIN DM Take 10 mLs by mouth every 4 (four) hours as needed for cough.   metFORMIN 500 MG tablet Commonly known as: GLUCOPHAGE Take 500 mg by mouth 2 (two) times daily.   vitamin C 1000 MG tablet Take 1,000 mg by mouth daily.   Vitamin D-3 125 MCG (5000 UT) Tabs Take 5,000 Units by mouth daily.   zinc gluconate 50 MG tablet Take 50 mg by mouth daily.       Follow-up Information    Fleet ContrasAvbuere, Edwin, MD. Schedule an appointment as soon as possible for a visit in 1 week(s).   Specialty: Internal Medicine Contact information: [address] [place] [id number] [phone]              No Known Allergies  Consultations:  None   Procedures/Studies: CT Angio Chest PE W and/or Wo Contrast  Result Date: 08/25/2020 CLINICAL DATA:  Dyspnea, tachycardia, hypoxia EXAM: CT ANGIOGRAPHY CHEST WITH CONTRAST TECHNIQUE: Multidetector CT imaging of the chest was performed using the standard protocol during bolus administration of intravenous contrast. A second bolus was required to adequately opacified the central pulmonary arteries. Multiplanar CT image reconstructions and MIPs were obtained to evaluate the vascular anatomy. CONTRAST:  175 cc  OMNIPAQUE IOHEXOL 350 MG/ML SOLN COMPARISON:  None. FINDINGS: Cardiovascular: There is adequate opacification of the pulmonary arterial tree. There is no intraluminal filling defect to suggest acute pulmonary embolism. The central pulmonary arteries are of normal caliber. No significant coronary artery calcification. There is borderline cardiomegaly with mild to moderate left ventricular hypertrophy. No pericardial effusion. The thoracic aorta is unremarkable. Mediastinum/Nodes: There is shotty mediastinal and bilateral hilar adenopathy without frank pathologic enlargement which may be reactive in nature, but is nonspecific. Thyroid unremarkable. Esophagus unremarkable. Lungs/Pleura: There is moderate diffuse, peripheral ground-glass pulmonary infiltrate noted within the lungs bilaterally, likely infectious or inflammatory in nature and typical of that seen in acute COVID-19 pneumonia. There is moderate parenchymal involvement. No pneumothorax or pleural effusion. The central airways are widely patent. Upper Abdomen: No acute abnormality. Musculoskeletal: No chest wall abnormality. No acute or significant osseous findings. Review of the MIP images confirms the above findings. IMPRESSION: No pulmonary embolism. Moderate ground-glass pulmonary infiltrates, likely infectious or inflammatory and typical of that seen in acute COVID-19 pneumonia. Borderline cardiomegaly with mild to moderate left ventricular hypertrophy noted. This would be better assessed with echocardiography if indicated. Electronically Signed   By: Helyn NumbersAshesh  Parikh MD  On: 08/25/2020 21:56   DG Chest Portable 1 View  Result Date: 08/25/2020 CLINICAL DATA:  History of COVID-19 positivity with shortness of breath EXAM: PORTABLE CHEST 1 VIEW COMPARISON:  01/29/2017 FINDINGS: Cardiac shadow is within normal limits. Lungs are well aerated bilaterally. Patchy airspace opacity is noted consistent with the given clinical history. No sizable effusion is  noted. No bony abnormality is seen. IMPRESSION: Patchy airspace opacities consistent with the given clinical history. Electronically Signed   By: Alcide Clever M.D.   On: 08/25/2020 20:05      Subjective: Patient seen and examined bedside, resting comfortably.  Has been titrated off of submental oxygen.  Feels her breathing is much improved and ready for discharge home.  Ambulated with nursing staff this morning on room air without any desaturations.  Patient states has ability to go to outpatient infusion center to complete remdesivir.  No other questions or concerns at this time.  Denies headache, no visual changes, no chest pain, no palpitations, no shortness of breath, no fever/chills/night sweats, no nausea/vomiting/diarrhea, no abdominal pain, no weakness, no fatigue, no paresthesias.  No acute events overnight per nursing staff.  Discharge Exam: Vitals:   08/26/20 0624 08/26/20 0851  BP: 134/83 (!) 143/89  Pulse: 99 (!) 103  Resp: 18 18  Temp: 97.6 F (36.4 C) 97.8 F (36.6 C)  SpO2: 96% 97%   Vitals:   08/26/20 0200 08/26/20 0300 08/26/20 0624 08/26/20 0851  BP: 139/79 124/71 134/83 (!) 143/89  Pulse: (!) 106 95 99 (!) 103  Resp: Temp:   97.6 F (36.4 C) 97.8 F (36.6 C)  TempSrc:   Oral Oral  SpO2: 94% 91% 96% 97%  Weight:   96.6 kg   Height:    (1.651 m)     General: Pt is alert, awake, not in acute distress Cardiovascular: RRR, S1/S2 +, no rubs, no gallops Respiratory: CTA bilaterally, no wheezing, no rhonchi Abdominal: Soft, NT, ND, bowel sounds + Extremities: no edema, no cyanosis    The results of significant diagnostics from this hospitalization (including imaging, microbiology, ancillary and laboratory) are listed below for reference.     Microbiology: Recent Results (from the past 240 hour(s))  Respiratory Panel by RT PCR (Flu A&B, Covid) - Nasopharyngeal Swab     Status: None   Collection Time: 08/25/20  8:00 PM   Specimen:  Nasopharyngeal Swab  Result Value Ref Range Status   SARS Coronavirus 2 by RT PCR NEGATIVE NEGATIVE Final    Comment: (NOTE) SARS-CoV-2 target nucleic acids are NOT DETECTED.  The SARS-CoV-2 RNA is generally detectable in upper respiratoy specimens during the acute phase of infection. The lowest concentration of SARS-CoV-2 viral copies this assay can detect is 131 copies/mL. A negative result does not preclude SARS-Cov-2 infection and should not be used as the sole basis for treatment or other patient management decisions. A negative result may occur with  improper specimen collection/handling, submission of specimen other than nasopharyngeal swab, presence of viral mutation(s) within the areas targeted by this assay, and inadequate number of viral copies (<131 copies/mL). A negative result must be combined with clinical observations, patient history, and epidemiological information. The expected result is Negative.  Fact Sheet for Patients:  https://www.moore.com/  Fact Sheet for Healthcare Providers:  https://www.young.biz/  This test is no t yet approved or cleared by the [place] FDA and  has been authorized for detection and/or diagnosis of SARS-CoV-2 by FDA under an Emergency Use  Authorization (EUA). This EUA will remain  in effect (meaning this test can be used) for the duration of the COVID-19 declaration under Section 564(b)(1) of the Act, 21 U.S.C. section 360bbb-3(b)(1), unless the authorization is terminated or revoked sooner.     Influenza A by PCR NEGATIVE NEGATIVE Final   Influenza B by PCR NEGATIVE NEGATIVE Final    Comment: (NOTE) The Xpert Xpress SARS-CoV-2/FLU/RSV assay is intended as an aid in  the diagnosis of influenza from Nasopharyngeal swab specimens and  should not be used as a sole basis for treatment. Nasal washings and  aspirates are unacceptable for Xpert Xpress SARS-CoV-2/FLU/RSV  testing.  Fact Sheet  for Patients: https://www.moore.com/  Fact Sheet for Healthcare Providers: https://www.young.biz/  This test is not yet approved or cleared by the [place] FDA and  has been authorized for detection and/or diagnosis of SARS-CoV-2 by  FDA under an Emergency Use Authorization (EUA). This EUA will remain  in effect (meaning this test can be used) for the duration of the  Covid-19 declaration under Section 564(b)(1) of the Act, 21  U.S.C. section 360bbb-3(b)(1), unless the authorization is  terminated or revoked. Performed at [facility], [address] [place], [place] [address]   Blood Culture (routine x 2)     Status: None (Preliminary result)   Collection Time: 08/25/20  8:00 PM   Specimen: BLOOD  Result Value Ref Range Status   Specimen Description   Final    BLOOD RIGHT ANTECUBITAL Performed at [facility], [address] [place], [place] [address]    Special Requests   Final    BOTTLES DRAWN AEROBIC AND ANAEROBIC Blood Culture adequate volume Performed at [facility], [address] [place], [place] [address]    Culture   Final    NO GROWTH < 12 HOURS Performed at [facility], [address] [place], [place] [address]    Report Status PENDING  Incomplete  Blood Culture (routine x 2)     Status: None (Preliminary result)   Collection Time: 08/25/20  8:12 PM   Specimen: BLOOD  Result Value Ref Range Status   Specimen Description   Final    BLOOD LEFT ANTECUBITAL Performed at [facility], [address] [place], [place] [address]    Special Requests   Final    BOTTLES DRAWN AEROBIC AND ANAEROBIC Blood Culture adequate volume Performed at [facility], [address] [place], [place] [address]    Culture   Final    NO GROWTH < 12 HOURS Performed at [facility], [address] [place], [place] [address]    Report Status PENDING  Incomplete      Labs: BNP (last 3 results) Recent Labs    08/25/20 2000  BNP 24.3   Basic Metabolic Panel: Recent Labs  Lab 08/25/20 2000  NA 135  K 3.4*  CL 100  CO2 25  GLUCOSE 254*  BUN 9  CREATININE 0.80  CALCIUM 8.8*   Liver Function Tests: Recent Labs  Lab 08/25/20 2000  AST 43*  ALT 43  ALKPHOS 76  BILITOT 0.6  PROT 7.2  ALBUMIN 3.3*   No results for input(s): LIPASE, AMYLASE in the last 168 hours. No results for input(s): AMMONIA in the last 168 hours. CBC: Recent Labs  Lab 08/26/20 0757  WBC 7.3  NEUTROABS 5.6  HGB 14.0  HCT 42.8  MCV 86.1  PLT 426*   Cardiac Enzymes: No results for input(s): CKTOTAL, CKMB, CKMBINDEX, TROPONINI in the last 168 hours. BNP: Invalid input(s): POCBNP CBG: Recent Labs  Lab 08/26/20 0039 08/26/20 0759  GLUCAP 259* 287*   D-Dimer Recent Labs    08/25/20 2000  DDIMER 0.59*   Hgb A1c No results for input(s): HGBA1C in the last 72 hours. Lipid Profile Recent Labs    08/25/20 2000  TRIG 972*   Thyroid function studies No results for input(s): TSH, T4TOTAL, T3FREE, THYROIDAB in the last 72 hours.  Invalid input(s): FREET3 Anemia work up Recent Labs    08/25/20 2045  FERRITIN 114   Urinalysis    Component Value Date/Time   COLORURINE YELLOW 11/10/2019 1314   APPEARANCEUR CLOUDY (A) 11/10/2019 1314   LABSPEC 1.020 11/10/2019 1314   PHURINE 6.0 11/10/2019 1314   GLUCOSEU NEGATIVE 11/10/2019 1314   HGBUR LARGE (A) 11/10/2019 1314   BILIRUBINUR NEGATIVE 11/10/2019 1314   KETONESUR NEGATIVE 11/10/2019 1314   PROTEINUR NEGATIVE 11/10/2019 1314   UROBILINOGEN 1.0 03/15/2015 1415   NITRITE NEGATIVE 11/10/2019 1314   LEUKOCYTESUR NEGATIVE 11/10/2019 1314   Sepsis Labs Invalid input(s): PROCALCITONIN,  WBC,  LACTICIDVEN Microbiology Recent Results (from the past 240 hour(s))  Respiratory Panel by RT PCR (Flu A&B, Covid) - Nasopharyngeal Swab     Status: None   Collection Time: 08/25/20  8:00 PM   Specimen:  Nasopharyngeal Swab  Result Value Ref Range Status   SARS Coronavirus 2 by RT PCR NEGATIVE NEGATIVE Final    Comment: (NOTE) SARS-CoV-2 target nucleic acids are NOT DETECTED.  The SARS-CoV-2 RNA is generally detectable in upper respiratoy specimens during the acute phase of infection. The lowest concentration of SARS-CoV-2 viral copies this assay can detect is 131 copies/mL. A negative result does not preclude SARS-Cov-2 infection and should not be used as the sole basis for treatment or other patient management decisions. A negative result may occur with  improper specimen collection/handling, submission of specimen other than nasopharyngeal swab, presence of viral mutation(s) within the areas targeted by this assay, and inadequate number of viral copies (<131 copies/mL). A negative result must be combined with clinical observations, patient history, and epidemiological information. The expected result is Negative.  Fact Sheet for Patients:  https://www.moore.com/  Fact Sheet for Healthcare Providers:  https://www.young.biz/  This test is no t yet approved or cleared by the [place] FDA and  has been authorized for detection and/or diagnosis of SARS-CoV-2 by FDA under an Emergency Use Authorization (EUA). This EUA will remain  in effect (meaning this test can be used) for the duration of the COVID-19 declaration under Section 564(b)(1) of the Act, 21 U.S.C. section 360bbb-3(b)(1), unless the authorization is terminated or revoked sooner.     Influenza A by PCR NEGATIVE NEGATIVE Final   Influenza B by PCR NEGATIVE NEGATIVE Final    Comment: (NOTE) The Xpert Xpress SARS-CoV-2/FLU/RSV assay is intended as an aid in  the diagnosis of influenza from Nasopharyngeal swab specimens and  should not be used as a sole basis for treatment. Nasal washings and  aspirates are unacceptable for Xpert Xpress SARS-CoV-2/FLU/RSV  testing.  Fact Sheet  for Patients: https://www.moore.com/  Fact Sheet for Healthcare Providers: https://www.young.biz/  This test is not yet approved or cleared by the [place] FDA and  has been authorized for detection and/or diagnosis of SARS-CoV-2 by  FDA under an Emergency Use Authorization (EUA). This EUA will remain  in effect (meaning this test can be  used) for the duration of the  Covid-19 declaration under Section 564(b)(1) of the Act, 21  U.S.C. section 360bbb-3(b)(1), unless the authorization is  terminated or revoked. Performed at [facility], [address] [place], [place] [address]   Blood Culture (routine x 2)     Status: None (Preliminary result)   Collection Time: 08/25/20  8:00 PM   Specimen: BLOOD  Result Value Ref Range Status   Specimen Description   Final    BLOOD RIGHT ANTECUBITAL Performed at [facility], [address] [place], [place] [address]    Special Requests   Final    BOTTLES DRAWN AEROBIC AND ANAEROBIC Blood Culture adequate volume Performed at [facility], [address] [place], [place] [address]    Culture   Final    NO GROWTH < 12 HOURS Performed at [facility], [address], [place], [place] [address]    Report Status PENDING  Incomplete  Blood Culture (routine x 2)     Status: None (Preliminary result)   Collection Time: 08/25/20  8:12 PM   Specimen: BLOOD  Result Value Ref Range Status   Specimen Description   Final    BLOOD LEFT ANTECUBITAL Performed at [facility], [address] [place], [place] [address]    Special Requests   Final    BOTTLES DRAWN AEROBIC AND ANAEROBIC Blood Culture adequate volume Performed at [facility], [address] [place], [place] [address]    Culture   Final    NO GROWTH < 12 HOURS Performed at [facility], [address] [place], [place] [address]    Report Status PENDING  Incomplete      Time coordinating discharge: Over 30 minutes  SIGNED:   Alvira Philips [place], DO  [facility] 08/26/2020, 11:10 AM

## 2020-08-26 NOTE — Progress Notes (Signed)
Pt's husband at front lobby to transport pt home via private vehicle. Pt alert and oriented x4 in no acute distress upon discharge. Discharge paperwork reviewed and given to pt, pt verbalized understanding. Pt rolled down to lobby via wheelchair by NT Zoya. Pt's belongings taken with pt.

## 2020-08-26 NOTE — Progress Notes (Signed)
Patient scheduled for outpatient Remdesivir infusions at 12 noon on Thursday 10/14, Friday 10/15 and Saturday 10/16 at [facility]. Please inform the patient to park at [address], [place], as staff will be escorting the patient through the east entrance of the hospital. Appointments take approximately 45 minutes.    There is a wave flag banner located near the entrance on [address] Turn into this entrance and immediately turn left or right and park in 1 of the 10 designated Covid Infusion Parking spots. There is a phone number on the sign, please call and let the staff know what spot you are in and we will come out and get you. For questions call [phone].  Thanks. 

## 2020-08-26 NOTE — Discharge Instructions (Signed)
Patient scheduled for outpatient Remdesivir infusions at 12 noon on Thursday 10/14, Friday 10/15 and Saturday 10/16 at [facility]. Please inform the patient to park at [address] [place], [place], as staff will be escorting the patient through the east entrance of the hospital. Appointments take approximately 45 minutes.    There is a wave flag banner located near the entrance on [address]. Turn into this entrance and immediately turn left or right and park in 1 of the 10 designated Covid Infusion Parking spots. There is a phone number on the sign, please call and let the staff know what spot you are in and we will come out and get you. For questions call [phone].  Thanks.

## 2020-08-26 NOTE — ED Notes (Signed)
Pt ate dinner, blanket and remote given.

## 2020-08-26 NOTE — ED Notes (Signed)
Communicated with admitting MD regarding blood sugar and SSI coverage, pt may eat per MD.  2 lean cuisines given and ginger ale.  CBG checked and MD notifed

## 2020-08-26 NOTE — Progress Notes (Signed)
SATURATION QUALIFICATIONS: (This note is used to comply with regulatory documentation for home oxygen)  Patient Saturations on Room Air at Rest = 99%  Patient Saturations on Room Air while Ambulating = 95-97%  Patient Saturations on 0 Liters of oxygen while Ambulating = % - Oxygen not required  Please briefly explain why patient needs home oxygen:

## 2020-08-26 NOTE — ED Notes (Signed)
[facility] called for transport to [facility]

## 2020-08-26 NOTE — ED Notes (Signed)
Contacted admitting MD to clarify NPO order

## 2020-08-26 NOTE — ED Notes (Signed)
Report given to [facility]

## 2020-08-27 ENCOUNTER — Ambulatory Visit (HOSPITAL_COMMUNITY)
Admit: 2020-08-27 | Discharge: 2020-08-27 | Disposition: A | Discharge status: Home / Self Care | Payer: 59 | Source: Ambulatory Visit | Attending: Pulmonary Disease | Admitting: Pulmonary Disease

## 2020-08-27 DIAGNOSIS — U071 COVID-19: Secondary | ICD-10-CM | POA: Insufficient documentation

## 2020-08-27 DIAGNOSIS — J1282 Pneumonia due to coronavirus disease 2019: Secondary | ICD-10-CM | POA: Diagnosis not present

## 2020-08-27 LAB — BLOOD CULTURE ID PANEL (REFLEXED) - BCID2

## 2020-08-27 LAB — HEMOGLOBIN A1C
Hgb A1c MFr Bld: 8.4 % — ABNORMAL HIGH (ref 4.8–5.6)
Mean Plasma Glucose: 194 mg/dL

## 2020-08-27 MED ORDER — DIPHENHYDRAMINE HCL 50 MG/ML IJ SOLN: 50.0000 mg | Freq: Once | INTRAMUSCULAR | Status: DC | PRN

## 2020-08-27 MED ORDER — ALBUTEROL SULFATE HFA 108 (90 BASE) MCG/ACT IN AERS: 2.0000 | INHALATION_SPRAY | Freq: Once | RESPIRATORY_TRACT | Status: DC | PRN

## 2020-08-27 MED ORDER — SODIUM CHLORIDE 0.9 % IV SOLN
100.0000 mg | Freq: Once | INTRAVENOUS | Status: AC
  Administered 2020-08-27: 100 mg via INTRAVENOUS

## 2020-08-27 MED ORDER — SODIUM CHLORIDE 0.9 % IV SOLN: INTRAVENOUS | Status: DC | PRN

## 2020-08-27 MED ORDER — METHYLPREDNISOLONE SODIUM SUCC 125 MG IJ SOLR: 125.0000 mg | Freq: Once | INTRAMUSCULAR | Status: DC | PRN

## 2020-08-27 MED ORDER — EPINEPHRINE 0.3 MG/0.3ML IJ SOAJ: 0.3000 mg | Freq: Once | INTRAMUSCULAR | Status: DC | PRN

## 2020-08-27 MED ORDER — FAMOTIDINE IN NACL 20-0.9 MG/50ML-% IV SOLN: 20.0000 mg | Freq: Once | INTRAVENOUS | Status: DC | PRN

## 2020-08-27 NOTE — Progress Notes (Signed)
  Diagnosis: COVID-19  Physician: Dr. Delford Field  Procedure: Covid Infusion Clinic Med: remdesivir infusion - Provided patient with remdesivir fact sheet for patients, parents and caregivers prior to infusion.  Complications: No immediate complications noted.  Discharge: Discharged home   Shawna Harris 08/27/2020

## 2020-08-28 ENCOUNTER — Ambulatory Visit (HOSPITAL_COMMUNITY)
Admit: 2020-08-28 | Discharge: 2020-08-28 | Disposition: A | Discharge status: Home / Self Care | Payer: 59 | Source: Ambulatory Visit | Attending: Pulmonary Disease | Admitting: Pulmonary Disease

## 2020-08-28 DIAGNOSIS — U071 COVID-19: Secondary | ICD-10-CM | POA: Insufficient documentation

## 2020-08-28 LAB — CULTURE, BLOOD (ROUTINE X 2): Special Requests: ADEQUATE

## 2020-08-28 LAB — C-REACTIVE PROTEIN

## 2020-08-28 MED ORDER — ALBUTEROL SULFATE HFA 108 (90 BASE) MCG/ACT IN AERS: 2.0000 | INHALATION_SPRAY | Freq: Once | RESPIRATORY_TRACT | Status: DC | PRN

## 2020-08-28 MED ORDER — SODIUM CHLORIDE 0.9 % IV SOLN: INTRAVENOUS | Status: DC | PRN

## 2020-08-28 MED ORDER — SODIUM CHLORIDE 0.9 % IV SOLN
100.0000 mg | Freq: Once | INTRAVENOUS | Status: AC
  Administered 2020-08-28: 100 mg via INTRAVENOUS
  Filled 2020-08-28: qty 20

## 2020-08-28 MED ORDER — DIPHENHYDRAMINE HCL 50 MG/ML IJ SOLN: 50.0000 mg | Freq: Once | INTRAMUSCULAR | Status: DC | PRN

## 2020-08-28 MED ORDER — METHYLPREDNISOLONE SODIUM SUCC 125 MG IJ SOLR: 125.0000 mg | Freq: Once | INTRAMUSCULAR | Status: DC | PRN

## 2020-08-28 MED ORDER — EPINEPHRINE 0.3 MG/0.3ML IJ SOAJ: 0.3000 mg | Freq: Once | INTRAMUSCULAR | Status: DC | PRN

## 2020-08-28 MED ORDER — FAMOTIDINE IN NACL 20-0.9 MG/50ML-% IV SOLN: 20.0000 mg | Freq: Once | INTRAVENOUS | Status: DC | PRN

## 2020-08-28 NOTE — Discharge Instructions (Signed)
10 Things You Can Do to Manage Your COVID-19 Symptoms at Home If you have possible or confirmed COVID-19: 1. Stay home from work and school. And stay away from other public places. If you must go out, avoid using any kind of public transportation, ridesharing, or taxis. 2. Monitor your symptoms carefully. If your symptoms get worse, call your healthcare provider immediately. 3. Get rest and stay hydrated. 4. If you have a medical appointment, call the healthcare provider ahead of time and tell them that you have or may have COVID-19. 5. For medical emergencies, call 911 and notify the dispatch personnel that you have or may have COVID-19. 6. Cover your cough and sneezes with a tissue or use the inside of your elbow. 7. Wash your hands often with soap and water for at least 20 seconds or clean your hands with an alcohol-based hand sanitizer that contains at least 60% alcohol. 8. As much as possible, stay in a specific room and away from other people in your home. Also, you should use a separate bathroom, if available. If you need to be around other people in or outside of the home, wear a mask. 9. Avoid sharing personal items with other people in your household, like dishes, towels, and bedding. 10. Clean all surfaces that are touched often, like counters, tabletops, and doorknobs. Use household cleaning sprays or wipes according to the label instructions. cdc.gov/coronavirus 05/15/2019 This information is not intended to replace advice given to you by your health care provider. Make sure you discuss any questions you have with your health care provider. Document Revised: 10/17/2019 Document Reviewed: 10/17/2019 Elsevier Patient Education  2020 Elsevier Inc.  

## 2020-08-28 NOTE — Progress Notes (Signed)
  Diagnosis: COVID-19  Physician: Dr Delford Field  Procedure: [facility] Med: remdesivir infusion - Provided patient with remdesivir fact sheet for patients, parents and caregivers prior to infusion.  Complications: No immediate complications noted.  Discharge: Discharged home   Shawna Harris 08/28/2020

## 2020-08-29 ENCOUNTER — Ambulatory Visit (HOSPITAL_COMMUNITY): Payer: 59

## 2020-08-30 ENCOUNTER — Ambulatory Visit (HOSPITAL_COMMUNITY): Payer: 59

## 2020-08-30 LAB — CULTURE, BLOOD (ROUTINE X 2)
Culture: NO GROWTH
Special Requests: ADEQUATE

## 2021-03-10 ENCOUNTER — Observation Stay (HOSPITAL_BASED_OUTPATIENT_CLINIC_OR_DEPARTMENT_OTHER): Payer: 59

## 2021-03-10 ENCOUNTER — Observation Stay (HOSPITAL_COMMUNITY)
Admission: EM | Admit: 2021-03-10 | Discharge: 2021-03-12 | Disposition: A | Discharge status: Home / Self Care | Payer: 59 | Attending: Emergency Medicine | Admitting: Emergency Medicine

## 2021-03-10 ENCOUNTER — Observation Stay (HOSPITAL_COMMUNITY): Payer: 59

## 2021-03-10 ENCOUNTER — Emergency Department (HOSPITAL_COMMUNITY): Payer: 59

## 2021-03-10 ENCOUNTER — Other Ambulatory Visit: Payer: Self-pay

## 2021-03-10 ENCOUNTER — Encounter (HOSPITAL_COMMUNITY): Payer: Self-pay

## 2021-03-10 DIAGNOSIS — Z7984 Long term (current) use of oral hypoglycemic drugs: Secondary | ICD-10-CM | POA: Insufficient documentation

## 2021-03-10 DIAGNOSIS — Z20822 Contact with and (suspected) exposure to covid-19: Secondary | ICD-10-CM | POA: Insufficient documentation

## 2021-03-10 DIAGNOSIS — Z79899 Other long term (current) drug therapy: Secondary | ICD-10-CM | POA: Diagnosis not present

## 2021-03-10 DIAGNOSIS — R0789 Other chest pain: Secondary | ICD-10-CM

## 2021-03-10 DIAGNOSIS — R079 Chest pain, unspecified: Principal | ICD-10-CM | POA: Insufficient documentation

## 2021-03-10 DIAGNOSIS — Z566 Other physical and mental strain related to work: Secondary | ICD-10-CM

## 2021-03-10 DIAGNOSIS — I10 Essential (primary) hypertension: Secondary | ICD-10-CM | POA: Diagnosis not present

## 2021-03-10 DIAGNOSIS — R200 Anesthesia of skin: Secondary | ICD-10-CM | POA: Insufficient documentation

## 2021-03-10 DIAGNOSIS — G459 Transient cerebral ischemic attack, unspecified: Secondary | ICD-10-CM | POA: Diagnosis not present

## 2021-03-10 DIAGNOSIS — Y900 Blood alcohol level of less than 20 mg/100 ml: Secondary | ICD-10-CM | POA: Diagnosis not present

## 2021-03-10 DIAGNOSIS — E236 Other disorders of pituitary gland: Secondary | ICD-10-CM | POA: Insufficient documentation

## 2021-03-10 DIAGNOSIS — E6609 Other obesity due to excess calories: Secondary | ICD-10-CM

## 2021-03-10 DIAGNOSIS — H8110 Benign paroxysmal vertigo, unspecified ear: Secondary | ICD-10-CM

## 2021-03-10 DIAGNOSIS — Z8616 Personal history of COVID-19: Secondary | ICD-10-CM | POA: Insufficient documentation

## 2021-03-10 DIAGNOSIS — R202 Paresthesia of skin: Secondary | ICD-10-CM | POA: Diagnosis not present

## 2021-03-10 DIAGNOSIS — E119 Type 2 diabetes mellitus without complications: Secondary | ICD-10-CM | POA: Insufficient documentation

## 2021-03-10 DIAGNOSIS — F17210 Nicotine dependence, cigarettes, uncomplicated: Secondary | ICD-10-CM | POA: Diagnosis not present

## 2021-03-10 DIAGNOSIS — Z789 Other specified health status: Secondary | ICD-10-CM | POA: Diagnosis not present

## 2021-03-10 DIAGNOSIS — Z6835 Body mass index (BMI) 35.0-35.9, adult: Secondary | ICD-10-CM

## 2021-03-10 HISTORY — DX: Obesity, unspecified: E66.9

## 2021-03-10 LAB — CBG MONITORING, ED
Glucose-Capillary: 289 mg/dL — ABNORMAL HIGH (ref 70–99)
Glucose-Capillary: 283 mg/dL — ABNORMAL HIGH (ref 70–99)

## 2021-03-10 LAB — COMPREHENSIVE METABOLIC PANEL
ALT: 14 U/L (ref 0–44)
AST: 18 U/L (ref 15–41)
Albumin: 3.5 g/dL (ref 3.5–5.0)
Alkaline Phosphatase: 61 U/L (ref 38–126)
Anion gap: 9 (ref 5–15)
BUN: 8 mg/dL (ref 6–20)
CO2: 21 mmol/L — ABNORMAL LOW (ref 22–32)
Calcium: 9 mg/dL (ref 8.9–10.3)
Chloride: 101 mmol/L (ref 98–111)
Creatinine, Ser: 0.64 mg/dL (ref 0.44–1.00)
GFR, Estimated: >60 mL/min (ref >60)
Glucose, Bld: 296 mg/dL — ABNORMAL HIGH (ref 70–99)
Potassium: 3.3 mmol/L — ABNORMAL LOW (ref 3.5–5.1)
Sodium: 131 mmol/L — ABNORMAL LOW (ref 135–145)
Total Bilirubin: 0.8 mg/dL (ref 0.3–1.2)
Total Protein: 6.2 g/dL — ABNORMAL LOW (ref 6.5–8.1)

## 2021-03-10 LAB — PROTIME-INR
INR: 0.9 (ref 0.8–1.2)
Prothrombin Time: 11.9 seconds (ref 11.4–15.2)

## 2021-03-10 LAB — CBC
HCT: 40.6 % (ref 36.0–46.0)
Hemoglobin: 14 g/dL (ref 12.0–15.0)
MCH: 30.1 pg (ref 26.0–34.0)
MCHC: 34.5 g/dL (ref 30.0–36.0)
MCV: 87.3 fL (ref 80.0–100.0)
Platelets: 351 10*3/uL (ref 150–400)
RBC: 4.65 MIL/uL (ref 3.87–5.11)
RDW: 13.6 % (ref 11.5–15.5)
WBC: 9.6 10*3/uL (ref 4.0–10.5)
nRBC: 0 % (ref 0.0–0.2)

## 2021-03-10 LAB — RESP PANEL BY RT-PCR (FLU A&B, COVID) ARPGX2
Influenza A by PCR: NEGATIVE
Influenza B by PCR: NEGATIVE
SARS Coronavirus 2 by RT PCR: NEGATIVE

## 2021-03-10 LAB — URINALYSIS, ROUTINE W REFLEX MICROSCOPIC
Bilirubin Urine: NEGATIVE
Glucose, UA: 50 mg/dL — AB
Ketones, ur: 5 mg/dL — AB
Leukocytes,Ua: NEGATIVE
Nitrite: NEGATIVE
Protein, ur: 100 mg/dL — AB
Specific Gravity, Urine: >1.046 — ABNORMAL HIGH (ref 1.005–1.030)
pH: 6 (ref 5.0–8.0)

## 2021-03-10 LAB — I-STAT CHEM 8, ED
BUN: 8 mg/dL (ref 6–20)
Calcium, Ion: 1.16 mmol/L (ref 1.15–1.40)
Chloride: 103 mmol/L (ref 98–111)
Creatinine, Ser: 0.4 mg/dL — ABNORMAL LOW (ref 0.44–1.00)
Glucose, Bld: 307 mg/dL — ABNORMAL HIGH (ref 70–99)
HCT: 41 % (ref 36.0–46.0)
Hemoglobin: 13.9 g/dL (ref 12.0–15.0)
Potassium: 3.4 mmol/L — ABNORMAL LOW (ref 3.5–5.1)
Sodium: 134 mmol/L — ABNORMAL LOW (ref 135–145)
TCO2: 21 mmol/L — ABNORMAL LOW (ref 22–32)

## 2021-03-10 LAB — GLUCOSE, CAPILLARY
Glucose-Capillary: 280 mg/dL — ABNORMAL HIGH (ref 70–99)
Glucose-Capillary: 229 mg/dL — ABNORMAL HIGH (ref 70–99)

## 2021-03-10 LAB — HEMOGLOBIN A1C
Hgb A1c MFr Bld: 11.2 % — ABNORMAL HIGH (ref 4.8–5.6)
Mean Plasma Glucose: 274.74 mg/dL

## 2021-03-10 LAB — ECHOCARDIOGRAM COMPLETE BUBBLE STUDY
AR max vel: 2.37 cm2
AV Area VTI: 2.37 cm2
AV Area mean vel: 2.47 cm2
AV Mean grad: 4 mmHg
AV Peak grad: 6.7 mmHg
Ao pk vel: 1.29 m/s
Area-P 1/2: 4.12 cm2
Calc EF: 59.3 %
S' Lateral: 3 cm
Single Plane A2C EF: 57.9 %
Single Plane A4C EF: 58.7 %

## 2021-03-10 LAB — DIFFERENTIAL
Abs Immature Granulocytes: 0.05 10*3/uL (ref 0.00–0.07)
Basophils Absolute: 0.1 10*3/uL (ref 0.0–0.1)
Basophils Relative: 1 %
Eosinophils Absolute: 0.1 10*3/uL (ref 0.0–0.5)
Eosinophils Relative: 1 %
Immature Granulocytes: 1 %
Lymphocytes Relative: 31 %
Lymphs Abs: 3 10*3/uL (ref 0.7–4.0)
Monocytes Absolute: 0.6 10*3/uL (ref 0.1–1.0)
Monocytes Relative: 7 %
Neutro Abs: 5.8 10*3/uL (ref 1.7–7.7)
Neutrophils Relative %: 59 %

## 2021-03-10 LAB — LIPID PANEL
Cholesterol: 274 mg/dL — ABNORMAL HIGH (ref 0–200)
Triglycerides: 1553 mg/dL — ABNORMAL HIGH (ref <150)
VLDL: UNDETERMINED mg/dL (ref 0–40)

## 2021-03-10 LAB — RAPID URINE DRUG SCREEN, HOSP PERFORMED
Amphetamines: NOT DETECTED
Barbiturates: NOT DETECTED
Benzodiazepines: NOT DETECTED
Cocaine: NOT DETECTED
Opiates: NOT DETECTED
Tetrahydrocannabinol: POSITIVE — AB

## 2021-03-10 LAB — TROPONIN I (HIGH SENSITIVITY)
Troponin I (High Sensitivity): 5 ng/L (ref <18)
Troponin I (High Sensitivity): 5 ng/L (ref <18)

## 2021-03-10 LAB — I-STAT BETA HCG BLOOD, ED (MC, WL, AP ONLY): I-stat hCG, quantitative: <5 m[IU]/mL (ref <5)

## 2021-03-10 LAB — APTT: aPTT: 22 seconds — ABNORMAL LOW (ref 24–36)

## 2021-03-10 LAB — LDL CHOLESTEROL, DIRECT: Direct LDL: 36.6 mg/dL (ref 0–99)

## 2021-03-10 LAB — ETHANOL: Alcohol, Ethyl (B): <10 mg/dL (ref <10)

## 2021-03-10 MED ORDER — AMLODIPINE BESYLATE 5 MG PO TABS
5.0000 mg | ORAL_TABLET | Freq: Every day | ORAL | Status: DC
  Administered 2021-03-10 – 2021-03-12 (×3): 5 mg via ORAL
  Filled 2021-03-10 (×3): qty 1

## 2021-03-10 MED ORDER — ACETAMINOPHEN 160 MG/5ML PO SOLN
650.0000 mg | ORAL | Status: DC | PRN
Start: 2021-03-10 — End: 2021-03-12

## 2021-03-10 MED ORDER — ENOXAPARIN SODIUM 40 MG/0.4ML ~~LOC~~ SOLN
40.0000 mg | SUBCUTANEOUS | Status: DC
  Administered 2021-03-10 – 2021-03-11 (×2): 40 mg via SUBCUTANEOUS
  Filled 2021-03-10 (×2): qty 0.4

## 2021-03-10 MED ORDER — CLOPIDOGREL BISULFATE 75 MG PO TABS
75.0000 mg | ORAL_TABLET | Freq: Every day | ORAL | Status: DC
  Filled 2021-03-10: qty 1

## 2021-03-10 MED ORDER — ASPIRIN 300 MG RE SUPP: 300.0000 mg | Freq: Every day | RECTAL | Status: DC

## 2021-03-10 MED ORDER — FENOFIBRATE 160 MG PO TABS
160.0000 mg | ORAL_TABLET | Freq: Every day | ORAL | Status: DC
  Administered 2021-03-10 – 2021-03-12 (×3): 160 mg via ORAL
  Filled 2021-03-10 (×3): qty 1

## 2021-03-10 MED ORDER — ATORVASTATIN CALCIUM 40 MG PO TABS
40.0000 mg | ORAL_TABLET | Freq: Every day | ORAL | Status: DC
  Administered 2021-03-10 – 2021-03-12 (×3): 40 mg via ORAL
  Filled 2021-03-10 (×3): qty 1

## 2021-03-10 MED ORDER — INSULIN ASPART 100 UNIT/ML ~~LOC~~ SOLN
0.0000 [IU] | Freq: Every day | SUBCUTANEOUS | Status: DC
  Administered 2021-03-10: 2 [IU] via SUBCUTANEOUS
  Administered 2021-03-11: 4 [IU] via SUBCUTANEOUS

## 2021-03-10 MED ORDER — ACETAMINOPHEN 325 MG PO TABS
650.0000 mg | ORAL_TABLET | ORAL | Status: DC | PRN
  Administered 2021-03-10: 650 mg via ORAL
  Filled 2021-03-10: qty 2

## 2021-03-10 MED ORDER — INSULIN ASPART 100 UNIT/ML ~~LOC~~ SOLN
0.0000 [IU] | Freq: Three times a day (TID) | SUBCUTANEOUS | Status: DC
  Administered 2021-03-10 – 2021-03-11 (×3): 8 [IU] via SUBCUTANEOUS
  Administered 2021-03-11: 11 [IU] via SUBCUTANEOUS
  Administered 2021-03-12: 3 [IU] via SUBCUTANEOUS
  Administered 2021-03-12: 5 [IU] via SUBCUTANEOUS

## 2021-03-10 MED ORDER — INSULIN GLARGINE 100 UNIT/ML ~~LOC~~ SOLN
10.0000 [IU] | Freq: Every day | SUBCUTANEOUS | Status: DC
  Administered 2021-03-11: 10 [IU] via SUBCUTANEOUS
  Filled 2021-03-10 (×2): qty 0.1

## 2021-03-10 MED ORDER — LORAZEPAM 2 MG/ML IJ SOLN
1.0000 mg | Freq: Once | INTRAMUSCULAR | Status: AC
  Administered 2021-03-10: 1 mg via INTRAVENOUS
  Filled 2021-03-10: qty 1

## 2021-03-10 MED ORDER — SODIUM CHLORIDE 0.9 % IV SOLN: INTRAVENOUS | Status: DC

## 2021-03-10 MED ORDER — HYDRALAZINE HCL 20 MG/ML IJ SOLN: 5.0000 mg | INTRAMUSCULAR | Status: DC | PRN

## 2021-03-10 MED ORDER — IOHEXOL 350 MG/ML SOLN
75.0000 mL | Freq: Once | INTRAVENOUS | Status: AC | PRN
  Administered 2021-03-10: 75 mL via INTRAVENOUS

## 2021-03-10 MED ORDER — ACETAMINOPHEN 650 MG RE SUPP: 650.0000 mg | RECTAL | Status: DC | PRN

## 2021-03-10 MED ORDER — ALBUTEROL SULFATE (2.5 MG/3ML) 0.083% IN NEBU: 2.5000 mg | INHALATION_SOLUTION | RESPIRATORY_TRACT | Status: DC | PRN

## 2021-03-10 MED ORDER — SENNOSIDES-DOCUSATE SODIUM 8.6-50 MG PO TABS: 1.0000 | ORAL_TABLET | Freq: Every evening | ORAL | Status: DC | PRN

## 2021-03-10 MED ORDER — ASPIRIN 81 MG PO CHEW
81.0000 mg | CHEWABLE_TABLET | Freq: Every day | ORAL | Status: DC
  Administered 2021-03-10 – 2021-03-12 (×3): 81 mg via ORAL
  Filled 2021-03-10 (×6): qty 1

## 2021-03-10 NOTE — ED Notes (Signed)
PA Petrucelli at bedside.

## 2021-03-10 NOTE — ED Notes (Signed)
ED Provider at bedside. 

## 2021-03-10 NOTE — ED Notes (Signed)
Ambulated pt to restroom pt stated that her menstrual cycle is too heavy to give a urine sample. Pt stated she would use a tampon from home the next time she uses the restroom and give a clean urine sample

## 2021-03-10 NOTE — Consult Note (Signed)
NEUROLOGY CONSULTATION NOTE   Date of service: March 10, 2021 Patient Name: Shawna Harris MRN:  [id number] DOB:  1975-12-26 Reason for consult: "Stroke code" _ _ _   _ __   _ __ _ _  __ __   _ __   __ _  History of Present Illness  Shawna Harris is a 45 y.o. female with PMH significant for DM2, HTN, HLD, smokes a little less than 1 ppd (quit today) who presents with sudden onset left neck and chest pain with numbness on the left side extending from her lip to her left arm and her left leg. 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 Symptoms started at 0115 on 03/10/21 and have been persistent. Her symptoms are mild. She has never had anything like this in the past.  No hx of afibb, no arm or leg weakness, no aphasia, no facial droop, no dysarthria.  In addition to above, she has been having dizziness which she describes as a combination of lightheadedness and vertigo for the last 2 weeks. This is off and on and triggered by moving her head or by standing up. Lasts a couple mins.  No chiropractic manipulations, no recent visits to a salon, no hyperextension injury to her neck  MRS: 0 NIHSS: 1 LKW: 0115 on 03/10/21 TPA: Not offered due to mild symptoms. Thrombectomy: Not offered due to mild symptoms.   ROS   Constitutional Denies weight loss, fever and chills.   HEENT Denies changes in vision and hearing.   Respiratory Denies SOB and cough.   CV Denies palpitations and CP   GI Denies abdominal pain, nausea, vomiting and diarrhea.  GU Denies dysuria and urinary frequency.  MSK Denies myalgia and joint pain.  Skin Denies rash and pruritus.  Neurological Denies headache and syncope.  Psychiatric Denies recent changes in mood. Denies anxiety and depression.   Past History   Past Medical History:  Diagnosis Date  . Diabetes mellitus without complication (HCC)   . Hypertension    Past Surgical History:  Procedure Laterality Date  . NO  PAST SURGERIES     History reviewed. No pertinent family history. Social History   Socioeconomic History  . Marital status: Single    Spouse name: Not on file  . Number of children: Not on file  . Years of education: Not on file  . Highest education level: Not on file  Occupational History  . Not on file  Tobacco Use  . Smoking status: Former Smoker    Packs/day: 0.50    Types: Cigarettes    Quit date: 08/23/2020    Years since quitting: 0.5  . Smokeless tobacco: Never Used  Substance and Sexual Activity  . Alcohol use: Yes    Comment: socially   . Drug use: No  . Sexual activity: Yes    Birth control/protection: None  Other Topics Concern  . Not on file  Social History Narrative  . Not on file   Social Determinants of Health   Financial Resource Strain: Not on file  Food Insecurity: Not on file  Transportation Needs: Not on file  Physical Activity: Not on file  Stress: Not on file  Social Connections: Not on file   No Known Allergies  Medications  (Not in a hospital admission)    Vitals   Vitals:   03/10/21 0301  BP: (!) 168/109  Pulse: (!) 114  Resp: 18  Temp: 98 F (36.7  C)  SpO2: 100%     There is no height or weight on file to calculate BMI.  Physical Exam   General: Laying comfortably in bed; in no acute distress. HENT: Normal oropharynx and mucosa. Normal external appearance of ears and nose.  Neck: Supple, no pain or tenderness  CV: No JVD. No peripheral edema.  Pulmonary: Symmetric Chest rise. Normal respiratory effort.  Abdomen: Soft to touch, non-tender.  Ext: No cyanosis, edema, or deformity  Skin: No rash. Normal palpation of skin.   Musculoskeletal: Normal digits and nails by inspection. No clubbing.   Neurologic Examination  Mental status/Cognition: Alert, oriented to self, place, month and year, good attention.  Speech/language: Fluent, comprehension intact, object naming intact, repetition intact.  Cranial nerves:   CN II  Pupils equal and reactive to light, no VF deficits    CN III,IV,VI EOM intact, no gaze preference or deviation, gaze evoked nystagmus with horizontal gaze to extreme right and slightly to extreme left. Does not change direction.   CN V normal sensation in V1, V2, and V3 segments bilaterally    CN VII no asymmetry, no nasolabial fold flattening    CN VIII normal hearing to speech    CN IX & X normal palatal elevation, no uvular deviation    CN XI 5/5 head turn and 5/5 shoulder shrug bilaterally    CN XII midline tongue protrusion    Motor:  Muscle bulk: normal, tone normal, pronator drift none tremor none Mvmt Root Nerve  Muscle Right Left Comments  SA C5/6 Ax Deltoid     EF C5/6 Mc Biceps 5 5   EE C6/7/8 Rad Triceps 5 5   WF C6/7 Med FCR     WE C7/8 PIN ECU     F Ab C8/T1 U ADM/FDI 5 5   HF L1/2/3 Fem Illopsoas 5 5 Giveaway weakness initially in BL HF but able to hold it up with 5/5 strength with encouragement.  KE L2/3/4 Fem Quad     DF L4/5 D Peron Tib Ant 5 5   PF S1/2 Tibial Grc/Sol 5 5    Reflexes:  Right Left Comments  Pectoralis      Biceps (C5/6) 1 1   Brachioradialis (C5/6) 1 1    Triceps (C6/7) 1 1    Patellar (L3/4) 1 1    Achilles (S1)      Hoffman      Plantar     Jaw jerk    Sensation:  Light touch Decreased to 70% in R face, arm and leg.   Pin prick    Temperature    Vibration   Proprioception    Coordination/Complex Motor:  - Finger to Nose intact BL - Heel to shin intact BL - Rapid alternating movement are normal - Gait: Deferred.  Labs   CBC:  Recent Labs  Lab 03/10/21 0331 03/10/21 0338  WBC 9.6  --   NEUTROABS 5.8  --   HGB 14.0 13.9  HCT 40.6 41.0  MCV 87.3  --   PLT 351  --     Basic Metabolic Panel:  Lab Results  Component Value Date   NA 134 (L) 03/10/2021   K 3.4 (L) 03/10/2021   CO2 25 08/25/2020   GLUCOSE 307 (H) 03/10/2021   BUN 8 03/10/2021   CREATININE 0.40 (L) 03/10/2021   CALCIUM 8.8 (L) 08/25/2020   GFRNONAA >60  08/25/2020   GFRAA >60 11/10/2019   Lipid Panel: No results found for: [facility]  HgbA1c:  Lab Results  Component Value Date   HGBA1C 8.4 (H) 08/26/2020   Urine Drug Screen: No results found for: LABOPIA, COCAINSCRNUR, LABBENZ, AMPHETMU, THCU, LABBARB  Alcohol Level No results found for: ETH  CT Head without contrast: Personally reviewed and CTH was negative for a large hypodensity concerning for a large territory infarct or hyperdensity concerning for an ICH  CT angio Head and Neck with contrast: Pending.  MRI Brain  pending Impression   HAROLD MATTES is a 45 y.o. female with stroke risk factors including DM2, HTN, HLD, Smoking who presents with episodic vertigo + lightheadedness x 2 weeks and sudden onset L sided face, ar mand leg numbness today that has not resolved. Neuro exam with gaze evoked nystagmus with horizontal gaze and L face, arm and leg numbness.  Suspect that her left sided symptoms today are probably TIA. As for the vertigo with lightheadedness over the last 2 weeks that is episodic and triggered by turning head, likely due to BPPV.  Recommendations  Plan:  - Frequent Neuro checks per stroke unit protocol - Recommend brain imaging with MRI Brain without contrast - Recommend Vascular imaging with CT angio head and neck to rule out dissection primarily. - TTE pending. - Lipid panel with LDL is pending. - Please start statin if LDL > 70 - HbA1c is pending. - Antithrombotic - Aspirin 81mg  daily + plavix 75mg  daily x 21 days, followed by aspirin 81mg  daily alone. - Recommend DVT ppx - SBP goal - permissive hypertension first 24 h < 220/110. Held home meds.  - Recommend Telemetry monitoring for arrythmia - Recommend bedside swallow screen prior to PO intake. - Stroke education booklet - Recommend PT/OT/SLP consult  BPPV: - Recommend outpatient vestibular rehab. ______________________________________________________________________   Thank you for the  opportunity to take part in the care of this patient. If you have any further questions, please contact the neurology consultation attending.  Signed,  [facility] Pager Number _ _ _   _ __   _ __ _ _  __ __   _ __   __ _

## 2021-03-10 NOTE — Progress Notes (Signed)
STROKE TEAM PROGRESS NOTE  Shawna Harris is a 45 y.o. female with stroke risk factors including DM2, HTN, HLD, Smoking who presents with episodic vertigo + lightheadedness x 2 weeks and sudden onset L sided face, arm and leg numbness with left lateral neck pain radiating to left upper chest and upper arm while on the phone working.  Neuro exam with gaze evoked nystagmus with horizontal gaze and L face, arm and leg numbness.   INTERVAL HISTORY Today she reports that numbness persists in LUE and LLE with tingling in left foot. Ativan given for MR brain made her feel better overall. She has been under a lot of stress. 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  She denies ongoing cervicalgia. She is on the phone for work a lot.  Her fiance is at the bedside.   We discussed her stroke like episode, ongoing work up and plan of care. We discussed her significant stroke risk factors and encouraged her to focus on improving control of DM2, HLD, and HTN. Stress management also addressed. Her questions were answered.   Vitals:   03/10/21 0500 03/10/21 0530 03/10/21 0600 03/10/21 0715  BP: (!) 145/98 (!) 134/93 131/87 122/89  Pulse: 96 98  99  Resp: 15 16 17 16   Temp:      SpO2:  99%  96%   CBC:  Recent Labs  Lab 03/10/21 0331 03/10/21 0338  WBC 9.6  --   NEUTROABS 5.8  --   HGB 14.0 13.9  HCT 40.6 41.0  MCV 87.3  --   PLT 351  --    Basic Metabolic Panel:  Recent Labs  Lab 03/10/21 0331 03/10/21 0338  NA 131* 134*  K 3.3* 3.4*  CL 101 103  CO2 21*  --   GLUCOSE 296* 307*  BUN 8 8  CREATININE 0.64 0.40*  CALCIUM 9.0  --    Lipid Panel:  Recent Labs  Lab 03/10/21 0523  CHOL 274*  TRIG 1,553*  HDL PENDING  CHOLHDL PENDING  VLDL UNABLE TO CALCULATE IF TRIGLYCERIDE OVER 400 mg/dL  LDLCALC NOT CALCULATED   HgbA1c:  Recent Labs  Lab 03/10/21 0522  HGBA1C 11.2*   Urine Drug Screen: No results for input(s): LABOPIA, COCAINSCRNUR, LABBENZ, AMPHETMU, THCU, LABBARB in the last 168  hours.  Alcohol Level  Recent Labs  Lab 03/10/21 0331  ETH <10   IMAGING and PERTINENT DIAGNOSTIC STUDIES  CT HEAD 1. Negative head CT.  No acute intracranial abnormality identified. 2. ASPECTS is 10.  CT H/N Negative for large vessel occlusion, and negative CTA head and neck. No atherosclerosis or stenosis.  MR BRAIN No evidence of recent infarction, hemorrhage, or mass. Subcentimeter T2 hyperintense lesion of the pituitary likely reflects an incidental cyst. A cystic adenoma is much less likely.  PHYSICAL EXAM  Temp:  [98 F (36.7 C)] 98 F (36.7 C) (04/27 0301) Pulse Rate:  [96-114] 106 (04/27 1024) Resp:  [15-24] 17 (04/27 1024) BP: (122-174)/(87-109) 135/101 (04/27 1024) SpO2:  [96 %-100 %] 98 % (04/27 1024)  General - Well nourished, well developed, in no apparent distress.  Resp: no extra work of breathing  CV: RRR Ext: no edema Mental Status -  Alert, oriented x4.  Language is intact.  Attention span and concentration were normal. Recent and remote memory were intact.  Cranial Nerves II - XII - II - Visual field intact OU III, IV, VI - Extraocular movements intact V - Facial sensation intact bilaterally VII - Facial  movement intact bilaterally VIII - Hearing & vestibular intact bilaterally X - Palate elevates symmetrically. XI - Chin turning & shoulder shrug intact bilaterally XII - Tongue protrusion intact  Motor Strength - The patient's strength was normal in all extremities and pronator drift was absent.  Bulk was normal and fasciculations were absent.   Motor Tone - Muscle tone was assessed at the neck and appendages and was normal.  Sensory - Light touch, temperature were assessed and were  Asymmetric. She reported diminished sensation (approx 50%) in left UE and LE throughout.  Coordination - The patient had normal movements in the hands and feet with no ataxia or dysmetria.    Gait and Station - Ambulating independently without difficulty  from bathroom, down hallway and settles herself in bed without assistance.   ASSESSMENT/PLAN Shawna Harris is a 45 y.o. female  with PMH significant for DM2, HTN, HLD, smokes a little less than 1 ppd (quit today) who presents with sudden onset left neck and chest pain with numbness on the left side extending from her lip to her left arm and her left leg. 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 Symptoms started at 0115 on 03/10/21 and have been persistent. MRI negative for stroke.   Episode of subjective left face and body paresthesias and complained by chest and neck and arm pain unlikely to be cerebrovascular in nature and likely due to underlying stress or compressive C-spine lesion in the setting of multiple stroke factors including uncontrolled DM2, HTN, obesity, hyperlipidemia    MR BRAIN: negative for stroke   CTA: no LVO  Carotid Doppler   2D Echo pending  LDL NOT CALCULATED  HgbA1c 11.2  No AC/AP prior to admission, Continue ASA 81mg  daily   Therapy recommendations:   Disposition:  Home   Hypertension  Home meds:  Norvasc 5mg  daily   Stable . Permissive hypertension (OK if < 220/120) but gradually normalize in 5-7 days . Long-term BP goal normotensive  Hyperlipidemia  Home meds: None  LDL could not be calculated due extremely high trig, direct LDL is pending  High intensity statin: Lipitor 40mg  started   Continue statin at discharge  Diabetes type II Uncontrolled  Home meds:  Inconsistently taking metformin  HgbA1c 11.2, goal < 7.0  CBGs Recent Labs    03/10/21 0323  GLUCAP 289*      Diabetes coordinator consult in progress  Management per primary team  Close follow up with PCP  Other Stroke Risk Factors  Former Cigarette smoker  Current ETOH use, alcohol level <10, advised to drink no more than 1drink(s) a day  Obesity, recommend weight loss, diet and exercise as appropriate   Other Active  Problems:   Cervicalgia/?Radicular symptoms  Cervical MRI is pending  Denies ongoing cervicalgia  Incidentally noted pituitary cyst  Follow up with PCP  Stress at home/work  Discussed relaxation and exercise, life style changes to manage stressors.    Hospital day # 0 I have personally obtained history,examined this patient, reviewed notes, independently viewed imaging studies, participated in medical decision making and plan of care.ROS completed by me personally and pertinent positives fully documented  I have made any additions or clarifications directly to the above note. Agree with note above.  She presented with episode of left face and body numbness in the setting of neck and chest pain in the mid arm pain and clinical semiology of episode is unlikely to represent TIA or  stroke and brain imaging is negative for acute stroke.  Recommend check MRI scan cervical spine to rule out compressive cervical spine lesion though I think underlying stress or anxiety may be contributing.  Recommend aspirin 81 mg daily given multiple stroke risk factors.  Long discussion patient and fianc at the bedside and answered questions.  Discussed with Dr. Jonah Blue.  Greater than 50% time during this 35-minute visit was spent on counseling and coordination of care about episode of possible TIA versus discussion of differential diagnosis and evaluation questions.  MRI scan cervical spine shows no compressive lesion or radiculopathy.  Stroke team will sign off.  And call for questions.  Delia Heady, MD Medical Director [facility] Pager: [phone] 03/10/2021 4:27 PM    To contact Stroke Continuity provider, please refer to [email or web address]. After hours, contact General Neurology

## 2021-03-10 NOTE — ED Notes (Signed)
PA Petrucelli notified pt passed swallow screen

## 2021-03-10 NOTE — ED Notes (Signed)
Patient transported to MRI via stretcher in stable condition 

## 2021-03-10 NOTE — Evaluation (Signed)
Physical Therapy Evaluation Patient Details Name: Shawna Harris MRN: [id number] DOB: 04-19-1976 Today's Date: 03/10/2021   History of Present Illness  Pt is a 45 y/o female admitted 4/27 secondary to left neck and chest pain with numbness on the left side extending from her lip to her left arm and her left leg. MRI negative for acute infarct. Pending cervical MRI. Pt also reporting dizziness. PMH includes HTN and DM.  Clinical Impression  Pt admitted secondary to problem above with deficits below. Pt reporting increased lightheadedness/dizziness and chest discomfort during session. Reports she could feel her heart beating fast. HR at mid 120s during mobility tasks. BP elevated at 143/95. Requiring min guard for mobility tasks this session. Pt reporting dizziness symptoms change with change in position or when she rolls over in bed. Would likely benefit from further assessment with vestibular PT. Will continue to follow acutely.     Follow Up Recommendations Other (comment) (TBD pending mobility progression)    Equipment Recommendations  None recommended by PT    Recommendations for Other Services       Precautions / Restrictions Precautions Precautions: Fall Restrictions Weight Bearing Restrictions: No      Mobility  Bed Mobility Overal bed mobility: Needs Assistance Bed Mobility: Supine to Sit;Sit to Supine     Supine to sit: Min assist Sit to supine: Supervision   General bed mobility comments: Min A for trunk elevation to come to sitting. Supervision for safety to return to supine. Reporting dizziness upon returning to supine.    Transfers Overall transfer level: Needs assistance Equipment used: None Transfers: Sit to/from Stand Sit to Stand: Min guard         General transfer comment: min gaurd for safety. Pt reporting unsteadiness and feeling off balance  Ambulation/Gait Ambulation/Gait assistance: Min guard Gait Distance (Feet): 3 Feet Assistive device:  None Gait Pattern/deviations: Step-through pattern;Decreased stride length Gait velocity: decreased   General Gait Details: Mild unsteadiness noted during short distance ambulation in room. No overt LOB noted. Pt reports mild lightheadedness. HR in mid 120s throughout gait and pt reports she could feel her heart beating. Checked BP upon return to supine and it was at 143/95.  Stairs            Wheelchair Mobility    Modified Rankin (Stroke Patients Only)       Balance Overall balance assessment: Needs assistance Sitting-balance support: No upper extremity supported Sitting balance-Leahy Scale: Fair     Standing balance support: No upper extremity supported Standing balance-Leahy Scale: Fair                               Pertinent Vitals/Pain Pain Assessment: Faces Faces Pain Scale: Hurts little more Pain Location: chest Pain Descriptors / Indicators: Aching Pain Intervention(s): Limited activity within patient's tolerance;Monitored during session;Repositioned    Home Living Family/patient expects to be discharged to:: Private residence Living Arrangements: Spouse/significant other Available Help at Discharge: Family Type of Home: House Home Access: Level entry     Home Layout: Two level Home Equipment: None      Prior Function Level of Independence: Independent               Hand Dominance        Extremity/Trunk Assessment   Upper Extremity Assessment Upper Extremity Assessment: Defer to OT evaluation    Lower Extremity Assessment Lower Extremity Assessment: LLE deficits/detail LLE Deficits / Details: Reports decreased  sensation in LLE    Cervical / Trunk Assessment Cervical / Trunk Assessment: Normal  Communication   Communication: No difficulties  Cognition Arousal/Alertness: Awake/alert Behavior During Therapy: WFL for tasks assessed/performed Overall Cognitive Status: Within Functional Limits for tasks assessed                                         General Comments General comments (skin integrity, edema, etc.): Did not note any nystagmus during transitions, however, would benefit from further assessment during next session    Exercises     Assessment/Plan    PT Assessment Patient needs continued PT services  PT Problem List Decreased balance;Decreased activity tolerance;Decreased mobility;Impaired sensation       PT Treatment Interventions DME instruction;Gait training;Functional mobility training;Stair training;Therapeutic activities;Therapeutic exercise;Balance training;Patient/family education    PT Goals (Current goals can be found in the Care Plan section)  Acute Rehab PT Goals Patient Stated Goal: to figure out what is going on PT Goal Formulation: With patient Time For Goal Achievement: 03/24/21 Potential to Achieve Goals: Good    Frequency Min 3X/week   Barriers to discharge        Co-evaluation               AM-PAC PT "6 Clicks" Mobility  Outcome Measure Help needed turning from your back to your side while in a flat bed without using bedrails?: None Help needed moving from lying on your back to sitting on the side of a flat bed without using bedrails?: A Little Help needed moving to and from a bed to a chair (including a wheelchair)?: A Little Help needed standing up from a chair using your arms (e.g., wheelchair or bedside chair)?: A Little Help needed to walk in hospital room?: A Little Help needed climbing 3-5 steps with a railing? : A Lot 6 Click Score: 18    End of Session   Activity Tolerance: Treatment limited secondary to medical complications (Comment) (lightheadedness) Patient left: in bed;with call bell/phone within reach;with family/visitor present (on stretcher in ED) Nurse Communication: Mobility status PT Visit Diagnosis: Other symptoms and signs involving the nervous system (Z16.967)    Time: 8938-1017 PT Time Calculation (min)  (ACUTE ONLY): 15 min   Charges:   PT Evaluation $PT Eval Moderate Complexity: 1 Mod          Cindee Salt, DPT  Acute Rehabilitation Services  Pager: [phone] Office: [phone]   [facility] Prom 03/10/2021, 2:46 PM

## 2021-03-10 NOTE — ED Triage Notes (Signed)
Pt c/o left back/chest/shoulder/neck/jaw pain, L arm numbness and lip numbness. Pt states these symptoms started at 0115. Pt denies nausea/vomiting. Has had shortness of breath.

## 2021-03-10 NOTE — Progress Notes (Signed)
Inpatient Diabetes Program Recommendations  AACE/ADA: New Consensus Statement on Inpatient Glycemic Control (2015)  Target Ranges:  Prepandial:   less than 140 mg/dL      Peak postprandial:   less than 180 mg/dL (1-2 hours)      Critically ill patients:  140 - 180 mg/dL   Results for BRITENY, FULGHUM (MRN [id number]) as of 03/10/2021 09:26  Ref. Range 03/10/2021 03:23  Glucose-Capillary Latest Ref Range: 70 - 99 mg/dL 289 (H)   Results for MUNIRA, POLSON (MRN [id number]) as of 03/10/2021 09:26  Ref. Range 08/26/2020 00:42 03/10/2021 05:22  Hemoglobin A1C Latest Ref Range: 4.8 - 5.6 % 8.4 (H) 11.2 (H)  (274 mg/dl)    To ED with Numbness and tingling of left side of body with neck pain and chest pain--Code stroke called. Seen by neurology. Not tPA candidate per neurology. CT head and CTA neck and head negative. Troponin negative. Placed in observation to complete stroke workup and MRI  History: DM  Home DM Meds: Metformin 500 mg BID  Current Orders: Lantus 10 units Daily      Novolog Moderate Correction Scale/ SSI (0-15 units) TID AC + HS     Current A1c of 11.2% shows very poor glucose control at home  PCP: Dr. Osborne Casco with [facility]--Per pt, was "fired" from the practice after 1 missed visit.  Needs to find another PCP.  Addendum 11:40am--Met w/ pt at beside down in the ED.  Fiance at bedside as well.  Pt told me she just recently restarted taking her Metformin on a consistent basis about 1-2 weeks ago.  Previous to that, has been very inconsistent with taking her Metformin.  Pt also admits to numerous dietary indiscretions including 2 cans of regular soda per day (mountain dew), snacks like chips, and overall unhealthy eating for the past 6 months.  Was seeing Dr. Osborne Casco with [facility]--Per pt, was "fired" from the practice after 1 missed visit.  Needs to find another PCP.  Has not seen PCP in sone time.  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  Told me she feels like she knows how to eat better and needs to make lifestyle changes at home.  Is Not interested in starting insulin yet--wants to try lifestyle changes and consistency with Metformin 1st.  Would be open to trying another oral medication like Amaryl, or Januvia, or SGLT-2 drug like Jardiance.  Strongly encouraged pt to seek care under another PCP soon after discharge.      --Will follow patient during hospitalization--  Wyn Quaker RN, MSN, CDE Diabetes Coordinator Inpatient Glycemic Control Team Team Pager: [phone] (8a-5p)

## 2021-03-10 NOTE — ED Notes (Signed)
MRI requesting sedative for pt prior to scanning

## 2021-03-10 NOTE — H&P (Signed)
History and Physical    Shawna Harris NGE:[id number] DOB: 1976-07-09 DOA: 03/10/2021  PCP: None - previously Dr. Osborne Casco Consultants:  None Patient coming from:   Home - lives with children and boyfriend; NOK: Donney Rankins, friend, [phone]   Chief Complaint: Neurologic symptoms  HPI: Shawna Harris is a 45 y.o. female with medical history significant of HTN and DM presenting with neurologic symptoms.  She noticed left-sided neck pain radiating into her L shoulder and L jaw and then across her left chest wall.  She noticed numbness/tingling on L side - face, arm, leg.  She has been stumbling while walking recently and is noticing dizzy spells including while turning over in bed.  She is having difficulty getting to the bathroom at times.  Out of nowhere, she gets dizzy and the world starts spinning and she feels like she will fall.  She received Ativan for the MRI, it did not resolve the pain but it helped with her shoulder and arm pain - it is still present but less and now coming back stronger.  Her father died 2 weeks ago and was buried this past weekend and this was unexpected so she has been stressed.      ED Course:  Carryover, per Dr. Tonie Griffith:  45 yo female with numbness and tinglilng of left side of body with neck pain and chest pain. Code stroke called. Seen by neurology. Not tPA candidate per neurology. CT head and CTA neck and head negative. Troponin negative. Placed in observation to complete stroke workup and MRI   Review of Systems: As per HPI; otherwise review of systems reviewed and negative.   Ambulatory Status:  Ambulates without assistance  COVID Vaccine Status:  None  Past Medical History:  Diagnosis Date  . Diabetes mellitus without complication ([place])   . Hypertension   . Obesity     Past Surgical History:  Procedure Laterality Date  . NO PAST SURGERIES      Social History   Socioeconomic History  . Marital status: Single    Spouse name:  Not on file  . Number of children: Not on file  . Years of education: Not on file  . Highest education level: Not on file  Occupational History  . Not on file  Tobacco Use  . Smoking status: Current Every Day Smoker    Packs/day: 1.00    Years: 28.00    Pack years: 28.00    Types: Cigarettes  . Smokeless tobacco: Never Used  Substance and Sexual Activity  . Alcohol use: Yes    Comment: heavy weekend drinking  . Drug use: No  . Sexual activity: Yes    Birth control/protection: None  Other Topics Concern  . Not on file  Social History Narrative  . Not on file   Social Determinants of Health   Financial Resource Strain: Not on file  Food Insecurity: Not on file  Transportation Needs: Not on file  Physical Activity: Not on file  Stress: Not on file  Social Connections: Not on file  Intimate Partner Violence: Not on file    No Known Allergies  Family History  Problem Relation Age of Onset  . Chronic Renal Failure Father   . Heart failure Father   . Stroke Neg Hx     Prior to Admission medications   Medication Sig Start Date End Date Taking? Authorizing Provider  albuterol (VENTOLIN HFA) 108 (90 Base) MCG/ACT inhaler Inhale 2 puffs into the lungs every 4 (four)  hours as needed for wheezing or shortness of breath. 08/26/20   [place] Eric J, DO  amLODipine (NORVASC) 5 MG tablet Take 1 tablet (5 mg total) by mouth daily. 01/27/19   Little, Wenda Overland, MD  Ascorbic Acid (VITAMIN C) 1000 MG tablet Take 1,000 mg by mouth daily.    [provider]  Cholecalciferol (VITAMIN D-3) 125 MCG (5000 UT) TABS Take 5,000 Units by mouth daily.    [provider]  guaiFENesin-dextromethorphan (ROBITUSSIN DM) 100-10 MG/5ML syrup Take 10 mLs by mouth every 4 (four) hours as needed for cough. 08/26/20   [place] Donnamarie Poag, DO  metFORMIN (GLUCOPHAGE) 500 MG tablet Take 500 mg by mouth 2 (two) times daily. 07/06/20   [provider]  zinc gluconate 50 MG tablet Take 50  mg by mouth daily.    [provider]    Physical Exam: Vitals:   03/10/21 1100 03/10/21 1400 03/10/21 1507 03/10/21 1522  BP: (!) 127/108 124/81 (!) 141/92 128/85  Pulse: 96 (!) 101 (!) 102 (!) 101  Resp: 19 (!) 22 19   Temp:   98 F (36.7 C) 98.6 F (37 C)  TempSrc:    Oral  SpO2: 98% 98% 99% 100%     . General:  Appears calm and comfortable and is in NAD . Eyes:   EOMI, normal lids, iris . ENT:  grossly normal hearing, lips & tongue, mmm  . Neck:  no LAD, masses or thyromegaly . Cardiovascular:  RRR, no m/r/g. No LE edema.  Marland Kitchen Respiratory:   CTA bilaterally with no wheezes/rales/rhonchi.  Normal respiratory effort. . Abdomen:  soft, NT, ND . Skin:  no rash or induration seen on limited exam . Musculoskeletal:  grossly normal tone BUE/BLE, good ROM, no bony abnormality . Lower extremity:  No LE edema.  Limited foot exam with no ulcerations.  2+ distal pulses. Marland Kitchen Psychiatric:  blunted mood and affect, speech fluent and appropriate, AOx3 . Neurologic:  CN 2-12 grossly intact, moves all extremities in coordinated fashion, sensation reportedly impaired along entire L face and body    Radiological Exams on Admission: Independently reviewed - see discussion in A/P where applicable  CT Angio Head W or Wo Contrast  Result Date: 03/10/2021 CLINICAL DATA:  45 year old female code stroke presentation. Left side symptoms. EXAM: CT ANGIOGRAPHY HEAD AND NECK TECHNIQUE: Multidetector CT imaging of the head and neck was performed using the standard protocol during bolus administration of intravenous contrast. Multiplanar CT image reconstructions and MIPs were obtained to evaluate the vascular anatomy. Carotid stenosis measurements (when applicable) are obtained utilizing NASCET criteria, using the distal internal carotid diameter as the denominator. CONTRAST:  18m OMNIPAQUE IOHEXOL 350 MG/ML SOLN COMPARISON:  Head CT 0336 hours today.  CTA chest 08/25/2020. FINDINGS: CTA NECK Skeleton:  Scattered periapical dental lucency on the left. No superimposed acute osseous abnormality. Evidence of degenerative cervical spine disc bulging at C5-C6. Upper chest: Small benign-appearing pneumatocyst in the left lung on series 5, image 171. Otherwise negative. Other neck: Negative. Aortic arch: 3 vessel arch configuration.  No arch atherosclerosis. Right carotid system: Negative. Mildly tortuous cervical right ICA distal to the bulb. Left carotid system: Negative. Vertebral arteries: Proximal right subclavian artery and right vertebral artery origin appear normal. There is mild right paravertebral venous contrast contamination limiting detail of the proximal right V2 segment, but the visible cervical right vertebral artery is patent and normal to the skull base. Proximal left subclavian artery and cervical left vertebral arteries are normal.  CTA HEAD Posterior circulation: Codominant V4 segments with mild tortuosity but no plaque or stenosis to the vertebrobasilar junction. Normal PICA origins. Patent basilar artery without stenosis. Patent SCA and PCA origins. Posterior communicating arteries are diminutive or absent. Bilateral PCA branches are within normal limits. Anterior circulation: Both ICA siphons are patent. No siphon plaque or stenosis. Carotid termini, MCA and ACA origins appear normal. Normal anterior communicating artery. Bilateral ACA branches are within normal limits. Left MCA M1 segment, bifurcation, and left MCA branches are within normal limits. Right MCA M1 segment, bifurcation, and right MCA branches are within normal limits. No right MCA branch occlusion identified. Venous sinuses: Early contrast timing. Superior sagittal sinus appears to be patent. Anatomic variants: None. Review of the MIP images confirms the above findings IMPRESSION: Negative for large vessel occlusion, and negative CTA head and neck. No atherosclerosis or stenosis. Electronically Signed   By: Genevie Ann M.D.   On:  03/10/2021 04:59   DG Chest 1 View  Result Date: 03/10/2021 CLINICAL DATA:  Stroke, dyspnea EXAM: CHEST  1 VIEW COMPARISON:  08/25/2020 FINDINGS: The heart size and mediastinal contours are within normal limits. Both lungs are clear. The visualized skeletal structures are unremarkable. IMPRESSION: No active disease. Electronically Signed   By: Fidela Salisbury MD   On: 03/10/2021 03:56   CT Angio Neck W and/or Wo Contrast  Result Date: 03/10/2021 CLINICAL DATA:  45 year old female code stroke presentation. Left side symptoms. EXAM: CT ANGIOGRAPHY HEAD AND NECK TECHNIQUE: Multidetector CT imaging of the head and neck was performed using the standard protocol during bolus administration of intravenous contrast. Multiplanar CT image reconstructions and MIPs were obtained to evaluate the vascular anatomy. Carotid stenosis measurements (when applicable) are obtained utilizing NASCET criteria, using the distal internal carotid diameter as the denominator. CONTRAST:  58m OMNIPAQUE IOHEXOL 350 MG/ML SOLN COMPARISON:  Head CT 0336 hours today.  CTA chest 08/25/2020. FINDINGS: CTA NECK Skeleton: Scattered periapical dental lucency on the left. No superimposed acute osseous abnormality. Evidence of degenerative cervical spine disc bulging at C5-C6. Upper chest: Small benign-appearing pneumatocyst in the left lung on series 5, image 171. Otherwise negative. Other neck: Negative. Aortic arch: 3 vessel arch configuration.  No arch atherosclerosis. Right carotid system: Negative. Mildly tortuous cervical right ICA distal to the bulb. Left carotid system: Negative. Vertebral arteries: Proximal right subclavian artery and right vertebral artery origin appear normal. There is mild right paravertebral venous contrast contamination limiting detail of the proximal right V2 segment, but the visible cervical right vertebral artery is patent and normal to the skull base. Proximal left subclavian artery and cervical left vertebral  arteries are normal. CTA HEAD Posterior circulation: Codominant V4 segments with mild tortuosity but no plaque or stenosis to the vertebrobasilar junction. Normal PICA origins. Patent basilar artery without stenosis. Patent SCA and PCA origins. Posterior communicating arteries are diminutive or absent. Bilateral PCA branches are within normal limits. Anterior circulation: Both ICA siphons are patent. No siphon plaque or stenosis. Carotid termini, MCA and ACA origins appear normal. Normal anterior communicating artery. Bilateral ACA branches are within normal limits. Left MCA M1 segment, bifurcation, and left MCA branches are within normal limits. Right MCA M1 segment, bifurcation, and right MCA branches are within normal limits. No right MCA branch occlusion identified. Venous sinuses: Early contrast timing. Superior sagittal sinus appears to be patent. Anatomic variants: None. Review of the MIP images confirms the above findings IMPRESSION: Negative for large vessel occlusion, and negative CTA head and neck.  No atherosclerosis or stenosis. Electronically Signed   By: Genevie Ann M.D.   On: 03/10/2021 04:59   MR BRAIN WO CONTRAST  Result Date: 03/10/2021 CLINICAL DATA:  Left-sided numbness EXAM: MRI HEAD WITHOUT CONTRAST TECHNIQUE: Multiplanar, multiecho pulse sequences of the brain and surrounding structures were obtained without intravenous contrast. COMPARISON:  None. FINDINGS: Brain: There is no acute infarction or intracranial hemorrhage. There is no intracranial mass, mass effect, or edema. There is no hydrocephalus or extra-axial fluid collection. Ventricles and sulci are normal in size and configuration. Vascular: Major vessel flow voids at the skull base are preserved. Skull and upper cervical spine: Normal marrow signal is preserved. Sinuses/Orbits: Paranasal sinuses are aerated. Orbits are unremarkable. Other: 9 mm T2 hyperintense lesion of the pituitary. Mastoid air cells are clear. IMPRESSION: No  evidence of recent infarction, hemorrhage, or mass. Subcentimeter T2 hyperintense lesion of the pituitary likely reflects an incidental cyst. A cystic adenoma is much less likely. Electronically Signed   By: Macy Mis M.D.   On: 03/10/2021 09:50   MR CERVICAL SPINE WO CONTRAST  Result Date: 03/10/2021 CLINICAL DATA:  Cervical radiculopathy, left cervicalgia with left upper extremity numbness EXAM: MRI CERVICAL SPINE WITHOUT CONTRAST TECHNIQUE: Multiplanar, multisequence MR imaging of the cervical spine was performed. No intravenous contrast was administered. COMPARISON:  None. FINDINGS: Alignment: Preserved. Vertebrae: Vertebral body heights are maintained. No substantial marrow edema. No suspicious osseous lesion. Cord: No abnormal signal. Posterior Fossa, vertebral arteries, paraspinal tissues: Unremarkable. Disc levels: Intervertebral disc heights and signal are maintained. No disc herniation. Small endplate osteophytes eccentric to the left at C4-C5 and C5-C6. No significant canal or foraminal stenosis at any level. IMPRESSION: No abnormal cord signal. No disc herniation or significant degenerative stenosis. Electronically Signed   By: Macy Mis M.D.   On: 03/10/2021 12:42   ECHOCARDIOGRAM COMPLETE BUBBLE STUDY  Result Date: 03/10/2021    ECHOCARDIOGRAM REPORT   Patient Name:   Shawna Harris Date of Exam: 03/10/2021 Medical Rec #:  [id number]          Height:       65.0 in Accession #:    [id number]         Weight:       213.0 lb Date of Birth:  [birth year]          BSA:          2.032 m Patient Age:    37 years           BP:           122/89 mmHg Patient Gender: F                  HR:           99 bpm. Exam Location:  Inpatient Procedure: 2D Echo, Cardiac Doppler and Color Doppler Indications:    Stroke  History:        Patient has no prior history of Echocardiogram examinations.                 Risk Factors:Hypertension and Diabetes.  Sonographer:    Cammy Brochure Referring Phys:  [id number] [place]  1. Left ventricular ejection fraction, by estimation, is 60 to 65%. The left ventricle has normal function. The left ventricle has no regional wall motion abnormalities. There is mild left ventricular hypertrophy. Left ventricular diastolic parameters were normal.  2. Right ventricular systolic function is normal. The right ventricular size is normal. Tricuspid regurgitation  signal is inadequate for assessing PA pressure.  3. The mitral valve is normal in structure. No evidence of mitral valve regurgitation.  4. The aortic valve is tricuspid. Aortic valve regurgitation is not visualized. No aortic stenosis is present.  5. The inferior vena cava is normal in size with greater than 50% respiratory variability, suggesting right atrial pressure of 3 mmHg.  6. Agitated saline contrast bubble study was negative, technically difficult study but no evidence of interatrial shunt was seen. FINDINGS  Left Ventricle: Left ventricular ejection fraction, by estimation, is 60 to 65%. The left ventricle has normal function. The left ventricle has no regional wall motion abnormalities. The left ventricular internal cavity size was normal in size. There is  mild left ventricular hypertrophy. Left ventricular diastolic parameters were normal. Right Ventricle: The right ventricular size is normal. No increase in right ventricular wall thickness. Right ventricular systolic function is normal. Tricuspid regurgitation signal is inadequate for assessing PA pressure. Left Atrium: Left atrial size was normal in size. Right Atrium: Right atrial size was normal in size. Pericardium: There is no evidence of pericardial effusion. Mitral Valve: The mitral valve is normal in structure. No evidence of mitral valve regurgitation. Tricuspid Valve: The tricuspid valve is normal in structure. Tricuspid valve regurgitation is trivial. Aortic Valve: The aortic valve is tricuspid. Aortic valve regurgitation is not  visualized. No aortic stenosis is present. Aortic valve mean gradient measures 4.0 mmHg. Aortic valve peak gradient measures 6.7 mmHg. Aortic valve area, by VTI measures 2.37 cm. Pulmonic Valve: The pulmonic valve was not well visualized. Pulmonic valve regurgitation is not visualized. Aorta: The aortic root and ascending aorta are structurally normal, with no evidence of dilitation. Venous: The inferior vena cava is normal in size with greater than 50% respiratory variability, suggesting right atrial pressure of 3 mmHg. IAS/Shunts: No atrial level shunt detected by color flow Doppler. Agitated saline contrast was given intravenously to evaluate for intracardiac shunting. Agitated saline contrast bubble study was negative, with no evidence of any interatrial shunt.  LEFT VENTRICLE PLAX 2D LVIDd:         4.40 cm     Diastology LVIDs:         3.00 cm     LV e' medial:    6.96 cm/s LV PW:         1.10 cm     LV E/e' medial:  13.0 LV IVS:        1.00 cm     LV e' lateral:   9.14 cm/s LVOT diam:     1.90 cm     LV E/e' lateral: 9.9 LV SV:         52 LV SV Index:   25 LVOT Area:     2.84 cm  LV Volumes (MOD) LV vol d, MOD A2C: 79.8 ml LV vol d, MOD A4C: 81.1 ml LV vol s, MOD A2C: 33.6 ml LV vol s, MOD A4C: 33.5 ml LV SV MOD A2C:     46.2 ml LV SV MOD A4C:     81.1 ml LV SV MOD BP:      48.6 ml RIGHT VENTRICLE            IVC RV Basal diam:  2.70 cm    IVC diam: 1.10 cm RV S prime:     8.16 cm/s TAPSE (M-mode): 2.4 cm LEFT ATRIUM             Index  RIGHT ATRIUM           Index LA diam:        3.30 cm 1.62 cm/m  RA Area:     14.20 cm LA Vol (A2C):   34.5 ml 16.98 ml/m RA Volume:   33.70 ml  16.59 ml/m LA Vol (A4C):   37.8 ml 18.61 ml/m LA Biplane Vol: 36.4 ml 17.92 ml/m  AORTIC VALVE AV Area (Vmax):    2.37 cm AV Area (Vmean):   2.47 cm AV Area (VTI):     2.37 cm AV Vmax:           129.00 cm/s AV Vmean:          88.800 cm/s AV VTI:            0.218 m AV Peak Grad:      6.7 mmHg AV Mean Grad:      4.0 mmHg  LVOT Vmax:         108.00 cm/s LVOT Vmean:        77.500 cm/s LVOT VTI:          0.182 m LVOT/AV VTI ratio: 0.83  AORTA Ao Root diam: 3.00 cm Ao Asc diam:  2.90 cm MITRAL VALVE MV Area (PHT): 4.12 cm    SHUNTS MV Decel Time: 184 msec    Systemic VTI:  0.18 m MV E velocity: 90.30 cm/s  Systemic Diam: 1.90 cm MV A velocity: 84.20 cm/s MV E/A ratio:  1.07 Oswaldo Milian MD Electronically signed by Oswaldo Milian MD Signature Date/Time: 03/10/2021/1:41:19 PM    Final    CT HEAD CODE STROKE WO CONTRAST  Result Date: 03/10/2021 CLINICAL DATA:  Code stroke. Initial evaluation for acute left-sided numbness. Do EXAM: CT HEAD WITHOUT CONTRAST TECHNIQUE: Contiguous axial images were obtained from the base of the skull through the vertex without intravenous contrast. COMPARISON:  None. FINDINGS: Brain: Cerebral volume within normal limits. No acute intracranial hemorrhage. No acute large vessel territory infarct. No mass lesion, midline shift or mass effect. No hydrocephalus or extra-axial fluid collection. Vascular: No hyperdense vessel. Skull: Scalp soft tissues and calvarium within normal limits. Sinuses/Orbits: Globes orbital soft tissues within normal limits. Paranasal sinuses are clear. No mastoid effusion. Other: None. ASPECTS [facility] Stroke Program Early CT Score) - Ganglionic level infarction (caudate, lentiform nuclei, internal capsule, insula, M1-M3 cortex): 7 - Supraganglionic infarction (M4-M6 cortex): 3 Total score (0-10 with 10 being normal): 10 IMPRESSION: 1. Negative head CT.  No acute intracranial abnormality identified. 2. ASPECTS is 10. These results were communicated to Dr. Lorrin Goodell At 3:44 amon 4/27/2022by text page via the [facility] messaging system. Electronically Signed   By: Jeannine Boga M.D.   On: 03/10/2021 03:46    EKG: Independently reviewed.  NSR with rate 100; no evidence of acute ischemia   Labs on Admission: I have personally reviewed the available labs and imaging  studies at the time of the admission.  Pertinent labs:   Na++ 131 K+ 3.3 Glucose 296 HS troponin 5, 5 Normal CBC INR 0.9 A1c 11.2 COVID/flu negative ETOH <10 HCG <5   Assessment/Plan Principal Problem:   Numbness and tingling of left upper and lower extremity Active Problems:   HTN (hypertension)   DM2 (diabetes mellitus, type 2) (HCC)   Stress at work   Class 2 obesity due to excess calories with body mass index (BMI) of 35.0 to 35.9 in adult   L face and body numbness and tingling -Patient reports acute onset of  pain and numbness of L face and upper and lower extremity -Concerning for TIA/CVA -Aspirin has been given to reduce stroke mortality and decrease morbidity -CVA has been ruled out with MRI but patient/boyfriend are still concerned about symptoms -Will observation overnight for further evaluation -Telemetry monitoring -Echo normal -Risk stratification with FLP, A1c; will also check UDS -Neurology consult -PT/OT/Nutrition Consults  HTN -Given negative MRI, does not need permissive HTN -Will resume home Norvasc -Will add prn IV hydralazine   HLD -LDL is only 36.6 -Triglycerides are significantly elevated at 1553, previously 972 -Needs good glycemic control as well as fenofibrate (added)  DM -Patient is very opposed to starting insulin - she is an Therapist, 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 but simply hasn't been doing it -That said, her A1c is markedly elevated (11.2) and she appears to need insulin -Hold metformin -Start Lantus 10 units qhs for now -Will cover with moderate-scale SSI -Diabetes coordinator met with her today and will plan to meet with her again tomorrow   Life stress -Her father recently died and she apparently also has other life stressors -She does not currently have a PCP -MRI brain and also C-spine negative - indicating that her symptoms are likely associated with stress  Obesity -BMI 35.4 -Weight loss should be  encouraged -Outpatient PCP/bariatric medicine/bariatric surgery f/u encouraged     Note: This patient has been tested and is negative for the novel coronavirus COVID-19. She has NOT been vaccinated against COVID-19.    DVT prophylaxis:  Lovenox  Code Status: Full - confirmed with patient/family Family Communication: Boyfriend present throughout evaluation Disposition Plan:  The patient is from: home  Anticipated d/c is to: home without [facility] services   Anticipated d/c date will depend on clinical response to treatment, but possibly as early as tomorrow if she has excellent response to treatment  Patient is currently: acutely ill Consults called: Neurology; PT/OT/Nutrition; TOC team; Diabetes coordinator Admission status: It is my clinical opinion that referral for OBSERVATION is reasonable and necessary in this patient based on the above information provided. The aforementioned taken together are felt to place the patient at high risk for further clinical deterioration. However it is anticipated that the patient may be medically stable for discharge from the hospital within 24 to 48 hours.     Karmen Bongo MD [facility]   How to contact the [facility] Attending or Consulting provider [place] or covering provider during after hours [place], for this patient?  1. Check the care team in [facility] and look for a) attending/consulting [facility] provider listed and b) the [facility] team listed 2. Log into [email or web address] and use [facility] universal password to access. If you do not have the password, please contact the hospital operator. 3. Locate the [facility] provider you are looking for under [facility] and page to a number that you can be directly reached. 4. If you still have difficulty reaching the provider, please page the [facility] (Director on Call) for the Hospitalists listed on amion for assistance.   03/10/2021, 5:49 PM

## 2021-03-10 NOTE — ED Provider Notes (Signed)
[facility] EMERGENCY DEPARTMENT Provider Note   CSN: [id number] Arrival date & time: 03/10/21  0246  An emergency department physician performed an initial assessment on this suspected stroke patient at 0320.  History Chief Complaint  Patient presents with  . Chest Pain  . Numbness    Shawna Harris is a 45 y.o. female with a hx of hypertension & diabetes mellitus who presents to the ED with complaints of chest pain & left sided numbness that began @ 01:15AM. Patient reports onset of pain to the left neck, chest, shoulder, and jaw that is difficult to describe with associated onset of paresthesias to the LUE, L foot, and left lips/tongue. Sxs constant since onset. No alleviating/aggravating factors. No hx of same. Denies weakness, dizziness, facial droop, slurred speech, dyspnea, or syncope.   HPI     Past Medical History:  Diagnosis Date  . Diabetes mellitus without complication (HCC)   . Hypertension     Patient Active Problem List   Diagnosis Date Noted  . HTN (hypertension) 08/26/2020  . DM2 (diabetes mellitus, type 2) (HCC) 08/26/2020  . Acute respiratory disease due to COVID-19 virus 08/25/2020  . [redacted] weeks gestation of pregnancy   . Abdominal pain in pregnancy, antepartum   . No prenatal care in current pregnancy     Past Surgical History:  Procedure Laterality Date  . NO PAST SURGERIES       OB History    Gravida  5   Para  4   Term  4   Preterm      AB  1   Living  4     SAB      IAB  1   Ectopic      Multiple      Live Births              History reviewed. No pertinent family history.  Social History   Tobacco Use  . Smoking status: Former Smoker    Packs/day: 0.50    Types: Cigarettes    Quit date: 08/23/2020    Years since quitting: 0.5  . Smokeless tobacco: Never Used  Substance Use Topics  . Alcohol use: Yes    Comment: socially   . Drug use: No    Home Medications Prior to Admission  medications   Medication Sig Start Date End Date Taking? Authorizing Provider  albuterol (VENTOLIN HFA) 108 (90 Base) MCG/ACT inhaler Inhale 2 puffs into the lungs every 4 (four) hours as needed for wheezing or shortness of breath. 08/26/20   [place], Eric J, DO  amLODipine (NORVASC) 5 MG tablet Take 1 tablet (5 mg total) by mouth daily. 01/27/19   Little, Ambrose [place], MD  Ascorbic Acid (VITAMIN C) 1000 MG tablet Take 1,000 mg by mouth daily.    [provider]  Cholecalciferol (VITAMIN D-3) 125 MCG (5000 UT) TABS Take 5,000 Units by mouth daily.    [provider]  guaiFENesin-dextromethorphan (ROBITUSSIN DM) 100-10 MG/5ML syrup Take 10 mLs by mouth every 4 (four) hours as needed for cough. 08/26/20   [place], Alvira Philips, DO  metFORMIN (GLUCOPHAGE) 500 MG tablet Take 500 mg by mouth 2 (two) times daily. 07/06/20   [provider]  zinc gluconate 50 MG tablet Take 50 mg by mouth daily.    [provider]    Allergies    Patient has no known allergies.  Review of Systems   Review of Systems  Constitutional: Negative for chills  and fever.  Respiratory: Negative for shortness of breath.   Cardiovascular: Positive for chest pain.  Gastrointestinal: Negative for abdominal pain and vomiting.  Neurological: Positive for numbness. Negative for dizziness, seizures, syncope, speech difficulty and weakness.  All other systems reviewed and are negative.   Physical Exam Updated Vital Signs BP (!) 168/109   Pulse (!) 114   Temp 98 F (36.7 C)   Resp 18   SpO2 100%   Physical Exam Vitals and nursing note reviewed.  Constitutional:      General: She is not in acute distress.    Appearance: She is well-developed. She is not toxic-appearing.  HENT:     Head: Normocephalic and atraumatic.  Eyes:     General:        Right eye: No discharge.        Left eye: No discharge.     Conjunctiva/sclera: Conjunctivae normal.  Cardiovascular:     Rate and Rhythm: Normal  rate and regular rhythm.     Pulses:          Radial pulses are 2+ on the right side and 2+ on the left side.       Posterior tibial pulses are 2+ on the right side and 2+ on the left side.  Pulmonary:     Effort: Pulmonary effort is normal. No respiratory distress.     Breath sounds: Normal breath sounds. No wheezing, rhonchi or rales.  Chest:     Chest wall: No tenderness.  Abdominal:     General: There is no distension.     Palpations: Abdomen is soft.     Tenderness: There is no abdominal tenderness.  Musculoskeletal:     Cervical back: Neck supple.     Comments: UEs: no obvious deformities or edema. Able to actively range at major joints. No focal bony tenderness.   Skin:    General: Skin is warm and dry.     Findings: No rash.  Neurological:     Mental Status: She is alert.     Comments: Clear speech. CN III-XII grossly intact with the exception of patient reported decreased sensation to the left lip/tongue. Patient reported decreased sensation to the LUE throughout and to the left foot compared to the right side of the body. 5/5 symmetric grip strength & strength with plantar/dorsiflexion bilaterally. Intact finger to nose. Negative pronator drift.   Psychiatric:        Behavior: Behavior normal.     ED Results / Procedures / Treatments   Labs (all labs ordered are listed, but only abnormal results are displayed) Labs Reviewed  APTT - Abnormal; Notable for the following components:      Result Value   aPTT 22 (*)    All other components within normal limits  CBG MONITORING, ED - Abnormal; Notable for the following components:   Glucose-Capillary 289 (*)    All other components within normal limits  I-STAT CHEM 8, ED - Abnormal; Notable for the following components:   Sodium 134 (*)    Potassium 3.4 (*)    Creatinine, Ser 0.40 (*)    Glucose, Bld 307 (*)    TCO2 21 (*)    All other components within normal limits  RESP PANEL BY RT-PCR (FLU A&B, COVID) ARPGX2   PROTIME-INR  CBC  DIFFERENTIAL  ETHANOL  COMPREHENSIVE METABOLIC PANEL  RAPID URINE DRUG SCREEN, HOSP PERFORMED  URINALYSIS, ROUTINE W REFLEX MICROSCOPIC  HEMOGLOBIN A1C  LIPID PANEL  I-STAT BETA HCG  BLOOD, ED ([facility], [facility], [facility] ONLY)  TROPONIN I (HIGH SENSITIVITY)    EKG None  Radiology CT Angio Head W or Wo Contrast  Result Date: 03/10/2021 CLINICAL DATA:  45 year old female code stroke presentation. Left side symptoms. EXAM: CT ANGIOGRAPHY HEAD AND NECK TECHNIQUE: Multidetector CT imaging of the head and neck was performed using the standard protocol during bolus administration of intravenous contrast. Multiplanar CT image reconstructions and MIPs were obtained to evaluate the vascular anatomy. Carotid stenosis measurements (when applicable) are obtained utilizing NASCET criteria, using the distal internal carotid diameter as the denominator. CONTRAST:  73mL OMNIPAQUE IOHEXOL 350 MG/ML SOLN COMPARISON:  Head CT 0336 hours today.  CTA chest 08/25/2020. FINDINGS: CTA NECK Skeleton: Scattered periapical dental lucency on the left. No superimposed acute osseous abnormality. Evidence of degenerative cervical spine disc bulging at C5-C6. Upper chest: Small benign-appearing pneumatocyst in the left lung on series 5, image 171. Otherwise negative. Other neck: Negative. Aortic arch: 3 vessel arch configuration.  No arch atherosclerosis. Right carotid system: Negative. Mildly tortuous cervical right ICA distal to the bulb. Left carotid system: Negative. Vertebral arteries: Proximal right subclavian artery and right vertebral artery origin appear normal. There is mild right paravertebral venous contrast contamination limiting detail of the proximal right V2 segment, but the visible cervical right vertebral artery is patent and normal to the skull base. Proximal left subclavian artery and cervical left vertebral arteries are normal. CTA HEAD Posterior circulation: Codominant V4 segments with mild tortuosity but  no plaque or stenosis to the vertebrobasilar junction. Normal PICA origins. Patent basilar artery without stenosis. Patent SCA and PCA origins. Posterior communicating arteries are diminutive or absent. Bilateral PCA branches are within normal limits. Anterior circulation: Both ICA siphons are patent. No siphon plaque or stenosis. Carotid termini, MCA and ACA origins appear normal. Normal anterior communicating artery. Bilateral ACA branches are within normal limits. Left MCA M1 segment, bifurcation, and left MCA branches are within normal limits. Right MCA M1 segment, bifurcation, and right MCA branches are within normal limits. No right MCA branch occlusion identified. Venous sinuses: Early contrast timing. Superior sagittal sinus appears to be patent. Anatomic variants: None. Review of the MIP images confirms the above findings IMPRESSION: Negative for large vessel occlusion, and negative CTA head and neck. No atherosclerosis or stenosis. Electronically Signed   By: Odessa Fleming M.D.   On: 03/10/2021 04:59   DG Chest 1 View  Result Date: 03/10/2021 CLINICAL DATA:  Stroke, dyspnea EXAM: CHEST  1 VIEW COMPARISON:  08/25/2020 FINDINGS: The heart size and mediastinal contours are within normal limits. Both lungs are clear. The visualized skeletal structures are unremarkable. IMPRESSION: No active disease. Electronically Signed   By: Helyn Numbers MD   On: 03/10/2021 03:56   CT Angio Neck W and/or Wo Contrast  Result Date: 03/10/2021 CLINICAL DATA:  45 year old female code stroke presentation. Left side symptoms. EXAM: CT ANGIOGRAPHY HEAD AND NECK TECHNIQUE: Multidetector CT imaging of the head and neck was performed using the standard protocol during bolus administration of intravenous contrast. Multiplanar CT image reconstructions and MIPs were obtained to evaluate the vascular anatomy. Carotid stenosis measurements (when applicable) are obtained utilizing NASCET criteria, using the distal internal carotid  diameter as the denominator. CONTRAST:  62mL OMNIPAQUE IOHEXOL 350 MG/ML SOLN COMPARISON:  Head CT 0336 hours today.  CTA chest 08/25/2020. FINDINGS: CTA NECK Skeleton: Scattered periapical dental lucency on the left. No superimposed acute osseous abnormality. Evidence of degenerative cervical spine disc bulging at C5-C6. Upper chest:  Small benign-appearing pneumatocyst in the left lung on series 5, image 171. Otherwise negative. Other neck: Negative. Aortic arch: 3 vessel arch configuration.  No arch atherosclerosis. Right carotid system: Negative. Mildly tortuous cervical right ICA distal to the bulb. Left carotid system: Negative. Vertebral arteries: Proximal right subclavian artery and right vertebral artery origin appear normal. There is mild right paravertebral venous contrast contamination limiting detail of the proximal right V2 segment, but the visible cervical right vertebral artery is patent and normal to the skull base. Proximal left subclavian artery and cervical left vertebral arteries are normal. CTA HEAD Posterior circulation: Codominant V4 segments with mild tortuosity but no plaque or stenosis to the vertebrobasilar junction. Normal PICA origins. Patent basilar artery without stenosis. Patent SCA and PCA origins. Posterior communicating arteries are diminutive or absent. Bilateral PCA branches are within normal limits. Anterior circulation: Both ICA siphons are patent. No siphon plaque or stenosis. Carotid termini, MCA and ACA origins appear normal. Normal anterior communicating artery. Bilateral ACA branches are within normal limits. Left MCA M1 segment, bifurcation, and left MCA branches are within normal limits. Right MCA M1 segment, bifurcation, and right MCA branches are within normal limits. No right MCA branch occlusion identified. Venous sinuses: Early contrast timing. Superior sagittal sinus appears to be patent. Anatomic variants: None. Review of the MIP images confirms the above findings  IMPRESSION: Negative for large vessel occlusion, and negative CTA head and neck. No atherosclerosis or stenosis. Electronically Signed   By: Odessa Fleming M.D.   On: 03/10/2021 04:59   CT HEAD CODE STROKE WO CONTRAST  Result Date: 03/10/2021 CLINICAL DATA:  Code stroke. Initial evaluation for acute left-sided numbness. Do EXAM: CT HEAD WITHOUT CONTRAST TECHNIQUE: Contiguous axial images were obtained from the base of the skull through the vertex without intravenous contrast. COMPARISON:  None. FINDINGS: Brain: Cerebral volume within normal limits. No acute intracranial hemorrhage. No acute large vessel territory infarct. No mass lesion, midline shift or mass effect. No hydrocephalus or extra-axial fluid collection. Vascular: No hyperdense vessel. Skull: Scalp soft tissues and calvarium within normal limits. Sinuses/Orbits: Globes orbital soft tissues within normal limits. Paranasal sinuses are clear. No mastoid effusion. Other: None. ASPECTS [facility] Stroke Program Early CT Score) - Ganglionic level infarction (caudate, lentiform nuclei, internal capsule, insula, M1-M3 cortex): 7 - Supraganglionic infarction (M4-M6 cortex): 3 Total score (0-10 with 10 being normal): 10 IMPRESSION: 1. Negative head CT.  No acute intracranial abnormality identified. 2. ASPECTS is 10. These results were communicated to Dr. Derry Lory At 3:44 amon 4/27/2022by text page via the [facility] messaging system. Electronically Signed   By: Rise Mu M.D.   On: 03/10/2021 03:46    Procedures .Critical Care Performed by: Cherly Anderson, PA-C Authorized by: Cherly Anderson, PA-C    CRITICAL CARE Performed by: Harvie Heck   Total critical care time: 30 minutes  Critical care time was exclusive of separately billable procedures and treating other patients.  Critical care was necessary to treat or prevent imminent or life-threatening deterioration.  Critical care was time spent personally by me on the  following activities: development of treatment plan with patient and/or surrogate as well as nursing, discussions with consultants, evaluation of patient's response to treatment, examination of patient, obtaining history from patient or surrogate, ordering and performing treatments and interventions, ordering and review of laboratory studies, ordering and review of radiographic studies, pulse oximetry and re-evaluation of patient's condition.    Medications Ordered in ED Medications  aspirin chewable tablet 81 mg (  has no administration in time range)    Or  aspirin suppository 300 mg (has no administration in time range)  clopidogrel (PLAVIX) tablet 75 mg (has no administration in time range)  iohexol (OMNIPAQUE) 350 MG/ML injection 75 mL (75 mLs Intravenous Contrast Given 03/10/21 0340)    ED Course  I have reviewed the triage vital signs and the nursing notes.  Pertinent labs & imaging results that were available during my care of the patient were reviewed by me and considered in my medical decision making (see chart for details).    MDM Rules/Calculators/A&P                          Patient presents to the ED with complaints of chest/neck/shoulder discomfort with left sided lip/tongue/upper extremity/foot sensation change which occurred @ 1:15AM. Seen by me in triage, discussed w/ attending Dr. Pilar PlateBero- code stroke activated, patient directly transported to CT.   Additional history obtained:  Additional history obtained from chart review & nursing note review.   Lab Tests:  I Ordered, reviewed, and interpreted labs, which included:  CBG: Elevated CBC: Unremarkable.  CMP: mild electrolyte abnormalities, hyperglycemia @ 296.  Ethanol: WNL PT/INR: WNL APTT: mildly decreased Troponin: WNL  Imaging Studies ordered:  I ordered imaging studies which included CT head wo, CTA head/neck, & CXR, I independently reviewed, formal radiology impression shows:  CT head: 1. Negative head CT.  No  acute intracranial abnormality identified. 2. ASPECTS is 10 CTA head/neck: Negative for large vessel occlusion, and negative CTA head and neck. No atherosclerosis or stenosis CXR: No active disease  ED Course:  In terms of chest pain, EKG- no STEMI, initial troponin WNL- delta pending. No widened mediastinum on CXR, symmetric pulses, considered aortic dissection however suspicion is low for this. Not hypoxic to raise concern for PE. CXR unremarkable Patient seen by neurologist Dr. Derry LoryKhaliqdina- no TPA, recommends admission for TIA work-up, will order MRI and additional studies.   Will discuss w/ hospitalist service for admission.   This is a shared visit with supervising physician Dr. Pilar PlateBero who has independently evaluated patient & provided guidance in evaluation/management/disposition, in agreement with care   05:58: CONSULT: Discussed with hospitliast Dr. Rachael Darbyhotiner- accepts admission.   Portions of this note were generated with Scientist, clinical (histocompatibility and immunogenetics)Dragon dictation software. Dictation errors may occur despite best attempts at proofreading.  Final Clinical Impression(s) / ED Diagnoses Final diagnoses:  Paresthesia  Chest pain, unspecified type    Rx / DC Orders ED Discharge Orders    None       Cherly Andersonetrucelli, Leticia Coletta R, PA-C 03/10/21 [id number]    Sabas SousBero, Michael M, MD 03/10/21 [phone]

## 2021-03-10 NOTE — Progress Notes (Incomplete)
  Echocardiogram 2D Echocardiogram has been performed.  Shirlean Kelly 03/10/2021, 11:48 AM

## 2021-03-11 DIAGNOSIS — E785 Hyperlipidemia, unspecified: Secondary | ICD-10-CM

## 2021-03-11 DIAGNOSIS — I10 Essential (primary) hypertension: Secondary | ICD-10-CM | POA: Diagnosis not present

## 2021-03-11 DIAGNOSIS — R202 Paresthesia of skin: Secondary | ICD-10-CM

## 2021-03-11 DIAGNOSIS — R079 Chest pain, unspecified: Secondary | ICD-10-CM | POA: Diagnosis not present

## 2021-03-11 DIAGNOSIS — R200 Anesthesia of skin: Secondary | ICD-10-CM

## 2021-03-11 LAB — GLUCOSE, CAPILLARY
Glucose-Capillary: 283 mg/dL — ABNORMAL HIGH (ref 70–99)
Glucose-Capillary: 329 mg/dL — ABNORMAL HIGH (ref 70–99)
Glucose-Capillary: 282 mg/dL — ABNORMAL HIGH (ref 70–99)
Glucose-Capillary: 341 mg/dL — ABNORMAL HIGH (ref 70–99)
Glucose-Capillary: 289 mg/dL — ABNORMAL HIGH (ref 70–99)

## 2021-03-11 LAB — D-DIMER, QUANTITATIVE: D-Dimer, Quant: 0.53 ug/mL-FEU — ABNORMAL HIGH (ref 0.00–0.50)

## 2021-03-11 LAB — TSH: TSH: 1.327 u[IU]/mL (ref 0.350–4.500)

## 2021-03-11 MED ORDER — SODIUM CHLORIDE 0.9% FLUSH
3.0000 mL | Freq: Two times a day (BID) | INTRAVENOUS | Status: DC
  Administered 2021-03-11 – 2021-03-12 (×2): 3 mL via INTRAVENOUS

## 2021-03-11 MED ORDER — METOPROLOL TARTRATE 12.5 MG HALF TABLET
12.5000 mg | ORAL_TABLET | Freq: Two times a day (BID) | ORAL | Status: DC
  Administered 2021-03-11 – 2021-03-12 (×2): 12.5 mg via ORAL
  Filled 2021-03-11 (×2): qty 1

## 2021-03-11 MED ORDER — SODIUM CHLORIDE 0.9 % IV SOLN: 250.0000 mL | INTRAVENOUS | Status: DC | PRN

## 2021-03-11 MED ORDER — SODIUM CHLORIDE 0.9 % WEIGHT BASED INFUSION: 1.0000 mL/kg/h | INTRAVENOUS | Status: DC

## 2021-03-11 MED ORDER — INSULIN GLARGINE 100 UNIT/ML ~~LOC~~ SOLN
10.0000 [IU] | Freq: Two times a day (BID) | SUBCUTANEOUS | Status: DC
  Administered 2021-03-11 – 2021-03-12 (×2): 10 [IU] via SUBCUTANEOUS
  Filled 2021-03-11 (×3): qty 0.1

## 2021-03-11 MED ORDER — SODIUM CHLORIDE 0.9% FLUSH: 3.0000 mL | INTRAVENOUS | Status: DC | PRN

## 2021-03-11 MED ORDER — SODIUM CHLORIDE 0.9 % WEIGHT BASED INFUSION
3.0000 mL/kg/h | INTRAVENOUS | Status: DC
  Administered 2021-03-12: 3 mL/kg/h via INTRAVENOUS

## 2021-03-11 NOTE — H&P (View-Only) (Signed)
Cardiology Consultation:   Patient ID: Shawna Harris MRN: [id number]; DOB: 1976/08/04  Admit date: 03/10/2021 Date of Consult: 03/11/2021  PCP:  Fleet Contras, MD   [facility]  Cardiologist:  New Advanced Practice Provider:  No care team member to display Electrophysiologist:  None  :[id number]}   Patient Profile:   Shawna Harris is a 45 y.o. female with a hx of HTN, DM, tobacco ue who is being seen today for the evaluation of chest pain at the request of Dr. Elvera Lennox.  History of Present Illness:   Shawna Harris has no prior cardiac history. No h/o MI, stent, CHF, arrhythmia, stroke. Family history with CHF in father. She was a smoker, just quit. Drinks alcohol occasionally. She eats edibles. 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  She presented to the ER 4/27 with left-sided neurologic symptoms.  While she was working patient started having left-sided neck pain, jaw pain, chest pain, arm pain.  The pain started in the neck and went down.  She had numbness and tingling.  Chest pain was left-sided and described as a tightness.  No shortness of breath, nausea, vomiting, diaphoresis.  She did not have a separate chest pain episode.  Symptoms persisted patient decided to go to the ER.  In the ER code stroke was called and was seen by Neurology. Not tPA candidate per neurology. CT head and CTA neck and head were negative. Troponin negative. Sodium 131, potassium 3.3, creatinine 0.64, BUN 8, WBC 9.6, Hgb 14.0.  Patient was admitted for further work-up.  On my interview patient reports chest pain is 1 out of 10.  She says chest pain is worse with exertion and has associated shortness of breath.   Past Medical History:  Diagnosis Date  . Diabetes mellitus without complication (HCC)   . Hypertension   . Obesity     Past Surgical History:  Procedure Laterality Date  . NO PAST SURGERIES       Home Medications:  Prior to Admission  medications   Medication Sig Start Date End Date Taking? Authorizing Provider  amLODipine (NORVASC) 5 MG tablet Take 1 tablet (5 mg total) by mouth daily. 01/27/19  Yes Little, Ambrose Finland, MD  Ascorbic Acid (VITAMIN C) 1000 MG tablet Take 1,000 mg by mouth daily.   Yes [provider]  Cholecalciferol (VITAMIN D-3) 125 MCG (5000 UT) TABS Take 5,000 Units by mouth daily.   Yes [provider]  metFORMIN (GLUCOPHAGE) 500 MG tablet Take 500 mg by mouth 2 (two) times daily. 07/06/20  Yes [provider]  Multiple Vitamin (MULTIVITAMIN) capsule Take 1 capsule by mouth every morning.   Yes [provider]  zinc gluconate 50 MG tablet Take 50 mg by mouth daily.   Yes [provider]  albuterol (VENTOLIN HFA) 108 (90 Base) MCG/ACT inhaler Inhale 2 puffs into the lungs every 4 (four) hours as needed for wheezing or shortness of breath. Patient not taking: No sig reported 08/26/20   [place], Eric J, DO  guaiFENesin-dextromethorphan (ROBITUSSIN DM) 100-10 MG/5ML syrup Take 10 mLs by mouth every 4 (four) hours as needed for cough. Patient not taking: No sig reported 08/26/20   [place], Eric J, DO    Inpatient Medications: Scheduled Meds: . amLODipine  5 mg Oral Daily  . aspirin  81 mg Oral Daily   Or  . aspirin  300 mg Rectal Daily  . atorvastatin  40 mg Oral Daily  .  enoxaparin (LOVENOX) injection  40 mg Subcutaneous Q24H  . fenofibrate  160 mg Oral Daily  . insulin aspart  0-15 Units Subcutaneous TID WC  . insulin aspart  0-5 Units Subcutaneous QHS  . insulin glargine  10 Units Subcutaneous Daily   Continuous Infusions: . sodium chloride 50 mL/hr at 03/10/21 1602   PRN Meds: acetaminophen **OR** acetaminophen (TYLENOL) oral liquid 160 mg/5 mL **OR** acetaminophen, albuterol, hydrALAZINE, senna-docusate  Allergies:   No Known Allergies  Social History:   Social History   Socioeconomic History  . Marital status: Single    Spouse name: Not on  file  . Number of children: Not on file  . Years of education: Not on file  . Highest education level: Not on file  Occupational History  . Not on file  Tobacco Use  . Smoking status: Current Every Day Smoker    Packs/day: 1.00    Years: 28.00    Pack years: 28.00    Types: Cigarettes  . Smokeless tobacco: Never Used  Substance and Sexual Activity  . Alcohol use: Yes    Comment: heavy weekend drinking  . Drug use: No  . Sexual activity: Yes    Birth control/protection: None  Other Topics Concern  . Not on file  Social History Narrative  . Not on file   Social Determinants of Health   Financial Resource Strain: Not on file  Food Insecurity: Not on file  Transportation Needs: Not on file  Physical Activity: Not on file  Stress: Not on file  Social Connections: Not on file  Intimate Partner Violence: Not on file    Family History:    Family History  Problem Relation Age of Onset  . Chronic Renal Failure Father   . Heart failure Father   . Stroke Neg Hx      ROS:  Please see the history of present illness.   All other ROS reviewed and negative.     Physical Exam/Data:   Vitals:   03/10/21 2326 03/11/21 0327 03/11/21 0725 03/11/21 1200  BP: (!) 127/93 (!) 140/92 (!) 135/99 118/82  Pulse: 98 96 93 94  Resp: 17 17 18 18   Temp: 98.2 F (36.8 C) 97.6 F (36.4 C) 98.6 F (37 C) 98.2 F (36.8 C)  TempSrc: Oral Oral Oral Oral  SpO2: 100% 98% 98%     Intake/Output Summary (Last 24 hours) at 03/11/2021 1340 Last data filed at 03/11/2021 1300 Gross per 24 hour  Intake 1653.66 ml  Output --  Net 1653.66 ml   Last 3 Weights 08/26/2020 08/25/2020 11/25/2019  Weight (lbs) 212 lb 15.4 oz 210 lb 221 lb 3.2 oz  Weight (kg) 96.6 kg 95.255 kg 100.336 kg     There is no height or weight on file to calculate BMI.  General:  Well nourished, well developed, in no acute distress HEENT: normal Lymph: no adenopathy Neck: no JVD Endocrine:  No thryomegaly Vascular: No  carotid bruits; FA pulses 2+ bilaterally without bruits  Cardiac:  normal S1, S2; RRR; no murmur  Lungs:  clear to auscultation bilaterally, no wheezing, rhonchi or rales  Abd: soft, nontender, no hepatomegaly  Ext: no edema Musculoskeletal:  No deformities, BUE and BLE strength normal and equal Skin: warm and dry  Neuro:  CNs 2-12 intact, no focal abnormalities noted Psych:  Normal affect   EKG:  The EKG was personally reviewed and demonstrates:  Sr, 100bpm, PVC, nonspecific ST/T wave changes Telemetry:  Telemetry was personally reviewed and  demonstrates:  SR, HR 90-110, PVCs  Relevant CV Studies:  Echo 03/10/21 1. Left ventricular ejection fraction, by estimation, is 60 to 65%. The  left ventricle has normal function. The left ventricle has no regional  wall motion abnormalities. There is mild left ventricular hypertrophy.  Left ventricular diastolic parameters  were normal.  2. Right ventricular systolic function is normal. The right ventricular  size is normal. Tricuspid regurgitation signal is inadequate for assessing  PA pressure.  3. The mitral valve is normal in structure. No evidence of mitral valve  regurgitation.  4. The aortic valve is tricuspid. Aortic valve regurgitation is not  visualized. No aortic stenosis is present.  5. The inferior vena cava is normal in size with greater than 50%  respiratory variability, suggesting right atrial pressure of 3 mmHg.  6. Agitated saline contrast bubble study was negative, technically  difficult study but no evidence of interatrial shunt was seen.    Laboratory Data:  High Sensitivity Troponin:   Recent Labs  Lab 03/10/21 0331 03/10/21 0523  TROPONINIHS 5 5     Chemistry Recent Labs  Lab 03/10/21 0331 03/10/21 0338  NA 131* 134*  K 3.3* 3.4*  CL 101 103  CO2 21*  --   GLUCOSE 296* 307*  BUN 8 8  CREATININE 0.64 0.40*  CALCIUM 9.0  --   GFRNONAA >60  --   ANIONGAP 9  --     Recent Labs  Lab  03/10/21 0331  PROT 6.2*  ALBUMIN 3.5  AST 18  ALT 14  ALKPHOS 61  BILITOT 0.8   Hematology Recent Labs  Lab 03/10/21 0331 03/10/21 0338  WBC 9.6  --   RBC 4.65  --   HGB 14.0 13.9  HCT 40.6 41.0  MCV 87.3  --   MCH 30.1  --   MCHC 34.5  --   RDW 13.6  --   PLT 351  --    BNPNo results for input(s): BNP, PROBNP in the last 168 hours.  DDimer  Recent Labs  Lab 03/11/21 0825  DDIMER 0.53*     Radiology/Studies:  CT Angio Head W or Wo Contrast  Result Date: 03/10/2021 CLINICAL DATA:  45 year old female code stroke presentation. Left side symptoms. EXAM: CT ANGIOGRAPHY HEAD AND NECK TECHNIQUE: Multidetector CT imaging of the head and neck was performed using the standard protocol during bolus administration of intravenous contrast. Multiplanar CT image reconstructions and MIPs were obtained to evaluate the vascular anatomy. Carotid stenosis measurements (when applicable) are obtained utilizing NASCET criteria, using the distal internal carotid diameter as the denominator. CONTRAST:  68mL OMNIPAQUE IOHEXOL 350 MG/ML SOLN COMPARISON:  Head CT 0336 hours today.  CTA chest 08/25/2020. FINDINGS: CTA NECK Skeleton: Scattered periapical dental lucency on the left. No superimposed acute osseous abnormality. Evidence of degenerative cervical spine disc bulging at C5-C6. Upper chest: Small benign-appearing pneumatocyst in the left lung on series 5, image 171. Otherwise negative. Other neck: Negative. Aortic arch: 3 vessel arch configuration.  No arch atherosclerosis. Right carotid system: Negative. Mildly tortuous cervical right ICA distal to the bulb. Left carotid system: Negative. Vertebral arteries: Proximal right subclavian artery and right vertebral artery origin appear normal. There is mild right paravertebral venous contrast contamination limiting detail of the proximal right V2 segment, but the visible cervical right vertebral artery is patent and normal to the skull base. Proximal left  subclavian artery and cervical left vertebral arteries are normal. CTA HEAD Posterior circulation: Codominant V4 segments with mild  tortuosity but no plaque or stenosis to the vertebrobasilar junction. Normal PICA origins. Patent basilar artery without stenosis. Patent SCA and PCA origins. Posterior communicating arteries are diminutive or absent. Bilateral PCA branches are within normal limits. Anterior circulation: Both ICA siphons are patent. No siphon plaque or stenosis. Carotid termini, MCA and ACA origins appear normal. Normal anterior communicating artery. Bilateral ACA branches are within normal limits. Left MCA M1 segment, bifurcation, and left MCA branches are within normal limits. Right MCA M1 segment, bifurcation, and right MCA branches are within normal limits. No right MCA branch occlusion identified. Venous sinuses: Early contrast timing. Superior sagittal sinus appears to be patent. Anatomic variants: None. Review of the MIP images confirms the above findings IMPRESSION: Negative for large vessel occlusion, and negative CTA head and neck. No atherosclerosis or stenosis. Electronically Signed   By: Odessa FlemingH  Hall M.D.   On: 03/10/2021 04:59   DG Chest 1 View  Result Date: 03/10/2021 CLINICAL DATA:  Stroke, dyspnea EXAM: CHEST  1 VIEW COMPARISON:  08/25/2020 FINDINGS: The heart size and mediastinal contours are within normal limits. Both lungs are clear. The visualized skeletal structures are unremarkable. IMPRESSION: No active disease. Electronically Signed   By: Helyn NumbersAshesh  Parikh MD   On: 03/10/2021 03:56   CT Angio Neck W and/or Wo Contrast  Result Date: 03/10/2021 CLINICAL DATA:  [age] year old female code stroke presentation. Left side symptoms. EXAM: CT ANGIOGRAPHY HEAD AND NECK TECHNIQUE: Multidetector CT imaging of the head and neck was performed using the standard protocol during bolus administration of intravenous contrast. Multiplanar CT image reconstructions and MIPs were obtained to evaluate  the vascular anatomy. Carotid stenosis measurements (when applicable) are obtained utilizing NASCET criteria, using the distal internal carotid diameter as the denominator. CONTRAST:  75mL OMNIPAQUE IOHEXOL 350 MG/ML SOLN COMPARISON:  Head CT 0336 hours today.  CTA chest 08/25/2020. FINDINGS: CTA NECK Skeleton: Scattered periapical dental lucency on the left. No superimposed acute osseous abnormality. Evidence of degenerative cervical spine disc bulging at C5-C6. Upper chest: Small benign-appearing pneumatocyst in the left lung on series 5, image 171. Otherwise negative. Other neck: Negative. Aortic arch: 3 vessel arch configuration.  No arch atherosclerosis. Right carotid system: Negative. Mildly tortuous cervical right ICA distal to the bulb. Left carotid system: Negative. Vertebral arteries: Proximal right subclavian artery and right vertebral artery origin appear normal. There is mild right paravertebral venous contrast contamination limiting detail of the proximal right V2 segment, but the visible cervical right vertebral artery is patent and normal to the skull base. Proximal left subclavian artery and cervical left vertebral arteries are normal. CTA HEAD Posterior circulation: Codominant V4 segments with mild tortuosity but no plaque or stenosis to the vertebrobasilar junction. Normal PICA origins. Patent basilar artery without stenosis. Patent SCA and PCA origins. Posterior communicating arteries are diminutive or absent. Bilateral PCA branches are within normal limits. Anterior circulation: Both ICA siphons are patent. No siphon plaque or stenosis. Carotid termini, MCA and ACA origins appear normal. Normal anterior communicating artery. Bilateral ACA branches are within normal limits. Left MCA M1 segment, bifurcation, and left MCA branches are within normal limits. Right MCA M1 segment, bifurcation, and right MCA branches are within normal limits. No right MCA branch occlusion identified. Venous sinuses:  Early contrast timing. Superior sagittal sinus appears to be patent. Anatomic variants: None. Review of the MIP images confirms the above findings IMPRESSION: Negative for large vessel occlusion, and negative CTA head and neck. No atherosclerosis or stenosis. Electronically Signed   By:  Odessa Fleming M.D.   On: 03/10/2021 04:59   MR BRAIN WO CONTRAST  Result Date: 03/10/2021 CLINICAL DATA:  Left-sided numbness EXAM: MRI HEAD WITHOUT CONTRAST TECHNIQUE: Multiplanar, multiecho pulse sequences of the brain and surrounding structures were obtained without intravenous contrast. COMPARISON:  None. FINDINGS: Brain: There is no acute infarction or intracranial hemorrhage. There is no intracranial mass, mass effect, or edema. There is no hydrocephalus or extra-axial fluid collection. Ventricles and sulci are normal in size and configuration. Vascular: Major vessel flow voids at the skull base are preserved. Skull and upper cervical spine: Normal marrow signal is preserved. Sinuses/Orbits: Paranasal sinuses are aerated. Orbits are unremarkable. Other: 9 mm T2 hyperintense lesion of the pituitary. Mastoid air cells are clear. IMPRESSION: No evidence of recent infarction, hemorrhage, or mass. Subcentimeter T2 hyperintense lesion of the pituitary likely reflects an incidental cyst. A cystic adenoma is much less likely. Electronically Signed   By: Guadlupe Spanish M.D.   On: 03/10/2021 09:50   MR CERVICAL SPINE WO CONTRAST  Result Date: 03/10/2021 CLINICAL DATA:  Cervical radiculopathy, left cervicalgia with left upper extremity numbness EXAM: MRI CERVICAL SPINE WITHOUT CONTRAST TECHNIQUE: Multiplanar, multisequence MR imaging of the cervical spine was performed. No intravenous contrast was administered. COMPARISON:  None. FINDINGS: Alignment: Preserved. Vertebrae: Vertebral body heights are maintained. No substantial marrow edema. No suspicious osseous lesion. Cord: No abnormal signal. Posterior Fossa, vertebral arteries,  paraspinal tissues: Unremarkable. Disc levels: Intervertebral disc heights and signal are maintained. No disc herniation. Small endplate osteophytes eccentric to the left at C4-C5 and C5-C6. No significant canal or foraminal stenosis at any level. IMPRESSION: No abnormal cord signal. No disc herniation or significant degenerative stenosis. Electronically Signed   By: Guadlupe Spanish M.D.   On: 03/10/2021 12:42   ECHOCARDIOGRAM COMPLETE BUBBLE STUDY  Result Date: 03/10/2021    ECHOCARDIOGRAM REPORT   Patient Name:   Shawna Harris Date of Exam: 03/10/2021 Medical Rec #:  [id number]          Height:       65.0 in Accession #:    [id number]         Weight:       213.0 lb Date of Birth:  [birth year]          BSA:          2.032 m Patient Age:    45 years           BP:           122/89 mmHg Patient Gender: F                  HR:           99 bpm. Exam Location:  Inpatient Procedure: 2D Echo, Cardiac Doppler and Color Doppler Indications:    Stroke  History:        Patient has no prior history of Echocardiogram examinations.                 Risk Factors:Hypertension and Diabetes.  Sonographer:    Shirlean Kelly Referring Phys: [id number] [facility] IMPRESSIONS  1. Left ventricular ejection fraction, by estimation, is 60 to 65%. The left ventricle has normal function. The left ventricle has no regional wall motion abnormalities. There is mild left ventricular hypertrophy. Left ventricular diastolic parameters were normal.  2. Right ventricular systolic function is normal. The right ventricular size is normal. Tricuspid regurgitation signal is inadequate for assessing PA pressure.  3. The  mitral valve is normal in structure. No evidence of mitral valve regurgitation.  4. The aortic valve is tricuspid. Aortic valve regurgitation is not visualized. No aortic stenosis is present.  5. The inferior vena cava is normal in size with greater than 50% respiratory variability, suggesting right atrial pressure of 3 mmHg.   6. Agitated saline contrast bubble study was negative, technically difficult study but no evidence of interatrial shunt was seen. FINDINGS  Left Ventricle: Left ventricular ejection fraction, by estimation, is 60 to 65%. The left ventricle has normal function. The left ventricle has no regional wall motion abnormalities. The left ventricular internal cavity size was normal in size. There is  mild left ventricular hypertrophy. Left ventricular diastolic parameters were normal. Right Ventricle: The right ventricular size is normal. No increase in right ventricular wall thickness. Right ventricular systolic function is normal. Tricuspid regurgitation signal is inadequate for assessing PA pressure. Left Atrium: Left atrial size was normal in size. Right Atrium: Right atrial size was normal in size. Pericardium: There is no evidence of pericardial effusion. Mitral Valve: The mitral valve is normal in structure. No evidence of mitral valve regurgitation. Tricuspid Valve: The tricuspid valve is normal in structure. Tricuspid valve regurgitation is trivial. Aortic Valve: The aortic valve is tricuspid. Aortic valve regurgitation is not visualized. No aortic stenosis is present. Aortic valve mean gradient measures 4.0 mmHg. Aortic valve peak gradient measures 6.7 mmHg. Aortic valve area, by VTI measures 2.37 cm. Pulmonic Valve: The pulmonic valve was not well visualized. Pulmonic valve regurgitation is not visualized. Aorta: The aortic root and ascending aorta are structurally normal, with no evidence of dilitation. Venous: The inferior vena cava is normal in size with greater than 50% respiratory variability, suggesting right atrial pressure of 3 mmHg. IAS/Shunts: No atrial level shunt detected by color flow Doppler. Agitated saline contrast was given intravenously to evaluate for intracardiac shunting. Agitated saline contrast bubble study was negative, with no evidence of any interatrial shunt.  LEFT VENTRICLE PLAX 2D  LVIDd:         4.40 cm     Diastology LVIDs:         3.00 cm     LV e' medial:    6.96 cm/s LV PW:         1.10 cm     LV E/e' medial:  13.0 LV IVS:        1.00 cm     LV e' lateral:   9.14 cm/s LVOT diam:     1.90 cm     LV E/e' lateral: 9.9 LV SV:         52 LV SV Index:   25 LVOT Area:     2.84 cm  LV Volumes (MOD) LV vol d, MOD A2C: 79.8 ml LV vol d, MOD A4C: 81.1 ml LV vol s, MOD A2C: 33.6 ml LV vol s, MOD A4C: 33.5 ml LV SV MOD A2C:     46.2 ml LV SV MOD A4C:     81.1 ml LV SV MOD BP:      48.6 ml RIGHT VENTRICLE            IVC RV Basal diam:  2.70 cm    IVC diam: 1.10 cm RV S prime:     8.16 cm/s TAPSE (M-mode): 2.4 cm LEFT ATRIUM             Index       RIGHT ATRIUM  Index LA diam:        3.30 cm 1.62 cm/m  RA Area:     14.20 cm LA Vol (A2C):   34.5 ml 16.98 ml/m RA Volume:   33.70 ml  16.59 ml/m LA Vol (A4C):   37.8 ml 18.61 ml/m LA Biplane Vol: 36.4 ml 17.92 ml/m  AORTIC VALVE AV Area (Vmax):    2.37 cm AV Area (Vmean):   2.47 cm AV Area (VTI):     2.37 cm AV Vmax:           129.00 cm/s AV Vmean:          88.800 cm/s AV VTI:            0.218 m AV Peak Grad:      6.7 mmHg AV Mean Grad:      4.0 mmHg LVOT Vmax:         108.00 cm/s LVOT Vmean:        77.500 cm/s LVOT VTI:          0.182 m LVOT/AV VTI ratio: 0.83  AORTA Ao Root diam: 3.00 cm Ao Asc diam:  2.90 cm MITRAL VALVE MV Area (PHT): 4.12 cm    SHUNTS MV Decel Time: 184 msec    Systemic VTI:  0.18 m MV E velocity: 90.30 cm/s  Systemic Diam: 1.90 cm MV A velocity: 84.20 cm/s MV E/A ratio:  1.07 Epifanio Lesches MD Electronically signed by Epifanio Lesches MD Signature Date/Time: 03/10/2021/1:41:19 PM    Final    CT HEAD CODE STROKE WO CONTRAST  Result Date: 03/10/2021 CLINICAL DATA:  Code stroke. Initial evaluation for acute left-sided numbness. Do EXAM: CT HEAD WITHOUT CONTRAST TECHNIQUE: Contiguous axial images were obtained from the base of the skull through the vertex without intravenous contrast. COMPARISON:  None.  FINDINGS: Brain: Cerebral volume within normal limits. No acute intracranial hemorrhage. No acute large vessel territory infarct. No mass lesion, midline shift or mass effect. No hydrocephalus or extra-axial fluid collection. Vascular: No hyperdense vessel. Skull: Scalp soft tissues and calvarium within normal limits. Sinuses/Orbits: Globes orbital soft tissues within normal limits. Paranasal sinuses are clear. No mastoid effusion. Other: None. ASPECTS [facility] Stroke Program Early CT Score) - Ganglionic level infarction (caudate, lentiform nuclei, internal capsule, insula, M1-M3 cortex): 7 - Supraganglionic infarction (M4-M6 cortex): 3 Total score (0-10 with 10 being normal): 10 IMPRESSION: 1. Negative head CT.  No acute intracranial abnormality identified. 2. ASPECTS is 10. These results were communicated to Dr. Derry Lory At 3:44 amon 4/27/2022by text page via the [facility] messaging system. Electronically Signed   By: Rise Mu M.D.   On: 03/10/2021 03:46     Assessment and Plan:   Left sided numbness/tingling -  imaging head was unremarkable - seen by neurology - Aspirin started - MRI head negative - Echo - PT/OT  Chest pain - HS trop negative  - EKG with Sr, 100bpm, and no significant ST/T wave changes - patient is reporting exertional chest pain with SOB - Has RF for CAD including DM, HTN, HLD, tobacco use - Echo showed LVEF 60-65%, mild LVH - continue aspirin - continue fenofibrate and atorvastatin - Start metoprolol 12.5mg  BID - After discussion with patient plan for cardiac cath Risks and benefits of cardiac catheterization have been discussed with the patient.  These include bleeding, infection, kidney damage, stroke, heart attack, death.  The patient understands these risks and is willing to proceed.   HTN - home amlodipine resumed - start BB As  above  HLD - LDL 36 - TG 1553, with uncontrolled DM - continue statin and fenofibrate  DM2 - A1C 11.2 - management  per primary team  For questions or updates, please contact [facility] Please consult [email or web address] for contact info under    Signed, Cadence David Stall, PA-C  03/11/2021 1:40 PM   Personally seen and examined. Agree with above.   45 year old female with uncontrolled diabetes hypertension tobacco use who has been experiencing exertional chest discomfort relieved with rest with symptoms of shortness of breath as well here for the evaluation of cardiac symptoms.  Troponin has been normal.  EKG with nonspecific ST-T wave changes.  She has had some left-sided numbness and tingling previously seen by neurology MRI of head was unremarkable.  Heart rate on ECG was around 100 bpm  Echocardiogram unremarkable with EF 60 to 65%.  Triglycerides 1500 in the setting of her uncontrolled diabetes.  Assessment and plan:  Exertional chest discomfort in the setting of uncontrolled diabetes hypertension tobacco use hypertriglyceridemia - We will go ahead and proceed tomorrow with cardiac catheterization, radial artery approach.  She is willing to proceed.  Risks and benefits including stroke heart attack death renal impairment bleeding have been discussed.  Continue to modify risk factors such as diabetes with hemoglobin A1c of 11.2.  Donato Schultz, MD

## 2021-03-11 NOTE — Progress Notes (Signed)
PROGRESS NOTE  Shawna Harris KZS:[id number] DOB: 17-Aug-1976 DOA: 03/10/2021 PCP: Fleet Contras, MD   LOS: 0 days   Brief Narrative / Interim history: 45 yo F with history of tobacco use, DM, HTN, HLD who came to the hospital with left sided jaw pain, going into her chest, left shoulder and left arm. She also experienced numbness and tingling into her left side of her face, and occasional dizzy spells.   Subjective / 24h Interval events: Continues to be worried about her symptoms. She is still having chest tightness with activities.   Assessment & Plan: Principal Problem Chest pain, left shoulder / jaw / arm pain - mainly with exertion. Troponin negative, 2D echo unremarkable, EKG with non specific changes. Given risk factors I have consulted cardiology, appreciate input -plans for cardiac cath tomorrow  Active Problems L face numbness / tingling - neurology consulted, workup negative. SIgned off  HLD - continue statin and fibrate  Tobacco use - counseled for cessation  DM - poorly controlled, with hyperglycemia. A1C 11. She reports less than ideal diet. Would like to avoid insulin. Diabetes coordinator following. Will increase Lantus  CBG (last 3)  Recent Labs    03/10/21 2123 03/11/21 0607 03/11/21 1159  GLUCAP 229* 283* 329*    Scheduled Meds: . amLODipine  5 mg Oral Daily  . aspirin  81 mg Oral Daily   Or  . aspirin  300 mg Rectal Daily  . atorvastatin  40 mg Oral Daily  . enoxaparin (LOVENOX) injection  40 mg Subcutaneous Q24H  . fenofibrate  160 mg Oral Daily  . insulin aspart  0-15 Units Subcutaneous TID WC  . insulin aspart  0-5 Units Subcutaneous QHS  . insulin glargine  10 Units Subcutaneous Daily  . metoprolol tartrate  12.5 mg Oral BID  . sodium chloride flush  3 mL Intravenous Q12H   Continuous Infusions: . sodium chloride 50 mL/hr at 03/10/21 1602   PRN Meds:.acetaminophen **OR** acetaminophen (TYLENOL) oral liquid 160 mg/5 mL **OR**  acetaminophen, albuterol, hydrALAZINE, senna-docusate  Diet Orders (From admission, onward)    Start     Ordered   03/10/21 1049  Diet heart healthy/carb modified Room service appropriate? Yes; Fluid consistency: Thin  Diet effective now       Question Answer Comment  Diet-HS Snack? Nothing   Room service appropriate? Yes   Fluid consistency: Thin      03/10/21 1050          DVT prophylaxis: enoxaparin (LOVENOX) injection 40 mg Start: 03/10/21 1100     Code Status: Full Code  Family Communication: fiancee at bedside   Status is: Observation  The patient will require care spanning > 2 midnights and should be moved to inpatient because: Inpatient level of care appropriate due to severity of illness  Dispo: The patient is from: Home              Anticipated d/c is to: Home              Patient currently is not medically stable to d/c.   Difficult to place patient No  Level of care: Telemetry Medical  Consultants:  Neurology  Cardiology   Procedures:  2D echo  Microbiology  none  Antimicrobials: none    Objective: Vitals:   03/10/21 2326 03/11/21 0327 03/11/21 0725 03/11/21 1200  BP: (!) 127/93 (!) 140/92 (!) 135/99 118/82  Pulse: 98 96 93 94  Resp: 17 17 18 18   Temp: 98.2 F (36.8  C) 97.6 F (36.4 C) 98.6 F (37 C) 98.2 F (36.8 C)  TempSrc: Oral Oral Oral Oral  SpO2: 100% 98% 98%     Intake/Output Summary (Last 24 hours) at 03/11/2021 1547 Last data filed at 03/11/2021 1300 Gross per 24 hour  Intake 1653.66 ml  Output --  Net 1653.66 ml   There were no vitals filed for this visit.  Examination:  Constitutional: NAD Eyes: no scleral icterus ENMT: Mucous membranes are moist.  Neck: normal, supple Respiratory: clear to auscultation bilaterally, no wheezing, no crackles. Normal respiratory effort. No accessory muscle use.  Cardiovascular: Regular rate and rhythm, no murmurs / rubs / gallops.  Abdomen: non distended, no tenderness. Bowel sounds  positive.  Musculoskeletal: no clubbing / cyanosis.  Skin: no rashes Neurologic: CN 2-12 grossly intact. Strength 5/5 in all 4.   Data Reviewed: I have independently reviewed following labs and imaging studies   CBC: Recent Labs  Lab 03/10/21 0331 03/10/21 0338  WBC 9.6  --   NEUTROABS 5.8  --   HGB 14.0 13.9  HCT 40.6 41.0  MCV 87.3  --   PLT 351  --    Basic Metabolic Panel: Recent Labs  Lab 03/10/21 0331 03/10/21 0338  NA 131* 134*  K 3.3* 3.4*  CL 101 103  CO2 21*  --   GLUCOSE 296* 307*  BUN 8 8  CREATININE 0.64 0.40*  CALCIUM 9.0  --    Liver Function Tests: Recent Labs  Lab 03/10/21 0331  AST 18  ALT 14  ALKPHOS 61  BILITOT 0.8  PROT 6.2*  ALBUMIN 3.5   Coagulation Profile: Recent Labs  Lab 03/10/21 0331  INR 0.9   HbA1C: Recent Labs    03/10/21 0522  HGBA1C 11.2*   CBG: Recent Labs  Lab 03/10/21 1243 03/10/21 1715 03/10/21 2123 03/11/21 0607 03/11/21 1159  GLUCAP 283* 280* 229* 283* 329*    Recent Results (from the past 240 hour(s))  Resp Panel by RT-PCR (Flu A&B, Covid) Nasopharyngeal Swab     Status: None   Collection Time: 03/10/21  5:02 AM   Specimen: Nasopharyngeal Swab; Nasopharyngeal(NP) swabs in vial transport medium  Result Value Ref Range Status   SARS Coronavirus 2 by RT PCR NEGATIVE NEGATIVE Final    Comment: (NOTE) SARS-CoV-2 target nucleic acids are NOT DETECTED.  The SARS-CoV-2 RNA is generally detectable in upper respiratory specimens during the acute phase of infection. The lowest concentration of SARS-CoV-2 viral copies this assay can detect is 138 copies/mL. A negative result does not preclude SARS-Cov-2 infection and should not be used as the sole basis for treatment or other patient management decisions. A negative result may occur with  improper specimen collection/handling, submission of specimen other than nasopharyngeal swab, presence of viral mutation(s) within the areas targeted by this assay, and  inadequate number of viral copies(<138 copies/mL). A negative result must be combined with clinical observations, patient history, and epidemiological information. The expected result is Negative.  Fact Sheet for Patients:  BloggerCourse.com  Fact Sheet for Healthcare Providers:  SeriousBroker.it  This test is no t yet approved or cleared by the [place] FDA and  has been authorized for detection and/or diagnosis of SARS-CoV-2 by FDA under an Emergency Use Authorization (EUA). This EUA will remain  in effect (meaning this test can be used) for the duration of the COVID-19 declaration under Section 564(b)(1) of the Act, 21 U.S.C.section 360bbb-3(b)(1), unless the authorization is terminated  or revoked sooner.  Influenza A by PCR NEGATIVE NEGATIVE Final   Influenza B by PCR NEGATIVE NEGATIVE Final    Comment: (NOTE) The Xpert Xpress SARS-CoV-2/FLU/RSV plus assay is intended as an aid in the diagnosis of influenza from Nasopharyngeal swab specimens and should not be used as a sole basis for treatment. Nasal washings and aspirates are unacceptable for Xpert Xpress SARS-CoV-2/FLU/RSV testing.  Fact Sheet for Patients: BloggerCourse.com  Fact Sheet for Healthcare Providers: SeriousBroker.it  This test is not yet approved or cleared by the [place] FDA and has been authorized for detection and/or diagnosis of SARS-CoV-2 by FDA under an Emergency Use Authorization (EUA). This EUA will remain in effect (meaning this test can be used) for the duration of the COVID-19 declaration under Section 564(b)(1) of the Act, 21 U.S.C. section 360bbb-3(b)(1), unless the authorization is terminated or revoked.  Performed at [facility], [address], [place], [place] [address]      Radiology Studies: No results found.   Pamella Pert, MD, PhD [facility]  Between 7 am - 7 pm I am available, please contact me via Amion (for emergencies) or Securechat (non urgent messages)  Between 7 pm - 7 am I am not available, please contact night coverage MD/APP via Amion

## 2021-03-11 NOTE — Plan of Care (Signed)
  Problem: Nutrition: Goal: Dietary intake will improve Outcome: Progressing   Problem: Self-Care: Goal: Ability to participate in self-care as condition permits will improve Outcome: Progressing Goal: Verbalization of feelings and concerns over difficulty with self-care will improve Outcome: Progressing Goal: Ability to communicate needs accurately will improve Outcome: Progressing

## 2021-03-11 NOTE — TOC Transition Note (Signed)
Transition of Care [facility]) - CM/SW Discharge Note   Patient Details  Name: SERENITEE FUERTES MRN: [id number] Date of Birth: 1976/09/28  [facility] CM/SW Contact:  Kermit Balo, RN Phone Number: 03/11/2021, 12:59 PM   Clinical Narrative:    Patient is without a PCP. She was in agreement with having CM assist in finding a PCP. Appt placed on AVS.  Pt agreeable to attend [facility] for vestibular PT. Information on AVS.  Pt has needed transportation. She denies issues with home medications.  Pt has transport home when medically ready.   Final next level of care: OP Rehab Barriers to Discharge: No Barriers Identified   Patient Goals and CMS Choice     Choice offered to / list presented to : Patient  Discharge Placement                       Discharge Plan and Services                                     Social Determinants of Health (SDOH) Interventions     Readmission Risk Interventions No flowsheet data found.

## 2021-03-11 NOTE — TOC CAGE-AID Note (Signed)
Transition of Care [facility]) - CAGE-AID Screening   Patient Details  Name: Shawna Harris MRN: [id number] Date of Birth: Jul 05, 1976  [facility] of [facility]) CM/SW Contact:    Kermit Balo, RN Phone Number: 03/11/2021, 11:23 AM   Clinical Narrative: Patient refused the resources for [facility] inpatient/ outpatient counseling.   CAGE-AID Screening:    Have You Ever Felt You Ought to Cut Down on Your Drinking or Drug Use?: No Have People Annoyed You By Critizing Your Drinking Or Drug Use?: No Have You Felt Bad Or Guilty About Your Drinking Or Drug Use?: No Have You Ever Had a Drink or Used Drugs First Thing In The Morning to Steady Your Nerves or to Get Rid of a Hangover?: No CAGE-AID Score: 0  Substance Abuse Education Offered: Yes (pt refused)

## 2021-03-11 NOTE — Progress Notes (Signed)
Occupational Therapy Evaluation  PTA pt works as a Engineer, civil (consulting) doing pt education, lives independently with her significant other. Pt states numbness/tingling in LUE has improved since yesterday in addition to feelings of dizziness. Dizziness appears to be worse with positional head changes. Educated pt on compensatory strategies to increase safety with ADL and functional mobility for ADL. Recommend pt use her shower seat at this time for bathing. Significant other states he will be able to provide necessary level of assistance. Educated on x1 VOR ex to complete independently. Educated on warning signs/symptoms of CVA using BeFast, in addition to lifestyle changes regarding diet and exercise. Pt verbalized understanding. No further OT needs.     03/11/21 1100  OT Visit Information  Last OT Received On 03/11/21  Assistance Needed +1  History of Present Illness Pt is a 45 y/o female admitted 4/27 secondary to left neck and chest pain with numbness on the left side extending from her lip to her left arm and her left leg. MRI negative for acute infarct. Pending cervical MRI. Pt also reporting dizziness. PMH includes HTN and DM.  Precautions  Precautions Fall  Home Living  Family/patient expects to be discharged to: Private residence  Living Arrangements Spouse/significant other  Available Help at Discharge Family;Available 24 hours/day  Type of Home House  Home Access Level entry  Home Layout Two level;Bed/bath upstairs  Alternate Level Stairs-Number of Steps flight  Alternate Level Stairs-Rails Left  Bathroom Shower/Tub Walk-in shower;Tub only  Horticulturist, commercial No  Home Equipment None  Prior Function  Level of Independence Independent  Comments works as Engineer, civil (consulting) for [employer] No difficulties  Pain Assessment  Pain Assessment 0-10  Pain Score 3  Pain Location chest  Pain Descriptors / Indicators Tightness  Pain Intervention(s) Limited  activity within patient's tolerance  Cognition  Arousal/Alertness Awake/alert  Behavior During Therapy WFL for tasks assessed/performed  Overall Cognitive Status Within Functional Limits for tasks assessed  Upper Extremity Assessment  Upper Extremity Assessment LUE deficits/detail  LUE Deficits / Details reports "tingling sensation" in fingertips; improved from yesterday; ROM, strength adn coordination [facility]  Lower Extremity Assessment  Lower Extremity Assessment Defer to PT evaluation  Cervical / Trunk Assessment  Cervical / Trunk Assessment Normal  ADL  Overall ADL's  Needs assistance/impaired  Functional mobility during ADLs Supervision/safety  General ADL Comments Educate don compensatory strategies to minimize head movment during ADL tasks. Pt has built in shower seat which was recommended to use. REcommend using figure four technique to comlete LB ADL. Pt verbalized understanding.  Vision- History  Baseline Vision/History Wears glasses;No visual deficits  Patient Visual Report No change from baseline  Vision- Assessment  Vision Assessment? Yes  Eye Alignment [facility]  Alignment/Gaze Preference WDL  Tracking/Visual Pursuits Able to track stimulus in all quads without difficulty  Saccades WFL  Convergence WFL  Visual Fields No apparent deficits  Perception  Comments WFL  Praxis  Praxis tested? WFL  Bed Mobility  Overal bed mobility Modified Independent  Transfers  Overall transfer level Needs assistance  Transfers Sit to/from Stand  Sit to Stand Supervision  Balance  Sitting balance-Leahy Scale Good  Standing balance support During functional activity  Standing balance-Leahy Scale Fair  General Comments  General comments (skin integrity, edema, etc.) Max HR 109  Exercises  Exercises Other exercises  Other Exercises  Other Exercises x1 VOR x 5  OT - End of Session  Equipment Utilized During Treatment Gait belt  Activity Tolerance Patient tolerated treatment well  Patient  left in bed;with call bell/phone within reach;with family/visitor present;with bed alarm set  Nurse Communication Mobility status  OT Assessment  OT Recommendation/Assessment Patient does not need any further OT services  OT Visit Diagnosis Dizziness and giddiness (R42);Pain  Pain - part of body  (chest)  OT Problem List Decreased knowledge of use of DME or AE;Obesity;Impaired sensation;Pain  AM-PAC OT "6 Clicks" Daily Activity Outcome Measure (Version 2)  Help from another person eating meals? 4  Help from another person taking care of personal grooming? 4  Help from another person toileting, which includes using toliet, bedpan, or urinal? 3  Help from another person bathing (including washing, rinsing, drying)? 3  Help from another person to put on and taking off regular upper body clothing? 4  Help from another person to put on and taking off regular lower body clothing? 3  6 Click Score 21  OT Recommendation  Follow Up Recommendations No OT follow up;Supervision - Intermittent  OT Equipment None recommended by OT  Acute Rehab OT Goals  Patient Stated Goal to figure out what is going on  OT Goal Formulation All assessment and education complete, DC therapy  OT Time Calculation  OT Start Time (ACUTE ONLY) 1123  OT Stop Time (ACUTE ONLY) 1145  OT Time Calculation (min) 22 min  OT General Charges  $OT Visit 1 Visit  OT Evaluation  $OT Eval Low Complexity 1 Low  Written Expression  Dominant Hand Right  Luisa Dago, OT/L   Acute OT Clinical Specialist Acute Rehabilitation Services Pager [phone] Office [phone]

## 2021-03-11 NOTE — Consult Note (Addendum)
Cardiology Consultation:   Patient ID: Shawna Harris MRN: [id number]; DOB: 04/15/1976  Admit date: 03/10/2021 Date of Consult: 03/11/2021  PCP:  Harris, Edwin, Harris   Shawna Harris  Cardiologist:  Shawna Harris:  No care team member to display Electrophysiologist:  None  :[id number]}   Patient Profile:   Shawna Harris is a 45 y.o. female with a hx of HTN, DM, tobacco ue who is being seen today for the evaluation of chest pain at the request of Dr. Gherghe.  History of Present Illness:   [name] has no prior cardiac history. No h/o MI, stent, CHF, arrhythmia, stroke. Family history with CHF in father. She was a smoker, just quit. Drinks alcohol occasionally. She eats edibles. 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  She presented to the ER 4/27 with left-sided neurologic symptoms.  While she was working patient started having left-sided neck pain, jaw pain, chest pain, arm pain.  The pain started in the neck and went down.  She had numbness and tingling.  Chest pain was left-sided and described as a tightness.  No shortness of breath, nausea, vomiting, diaphoresis.  She did not have a separate chest pain episode.  Symptoms persisted patient decided to go to the ER.  In the ER code stroke was called and was seen by Neurology. Not tPA candidate per neurology. CT head and CTA neck and head were negative. Troponin negative. Sodium 131, potassium 3.3, creatinine 0.64, BUN 8, WBC 9.6, Hgb 14.0.  Patient was admitted for further work-up.  On my interview patient reports chest pain is 1 out of 10.  She says chest pain is worse with exertion and has associated shortness of breath.   Past Medical History:  Diagnosis Date  . Diabetes mellitus without complication (HCC)   . Hypertension   . Obesity     Past Surgical History:  Procedure Laterality Date  . NO PAST SURGERIES       Home Medications:  Prior to Admission  medications   Medication Sig Start Date End Date Taking? Authorizing Harris  amLODipine (NORVASC) 5 MG tablet Take 1 tablet (5 mg total) by mouth daily. 01/27/19  Yes Little, Rachel Morgan, Harris  Ascorbic Acid (VITAMIN C) 1000 MG tablet Take 1,000 mg by mouth daily.   Yes Shawna Harris  Cholecalciferol (VITAMIN D-3) 125 MCG (5000 UT) TABS Take 5,000 Units by mouth daily.   Yes Shawna Harris  metFORMIN (GLUCOPHAGE) 500 MG tablet Take 500 mg by mouth 2 (two) times daily. 07/06/20  Yes Shawna Harris  Multiple Vitamin (MULTIVITAMIN) capsule Take 1 capsule by mouth every morning.   Yes Shawna Harris  zinc gluconate 50 MG tablet Take 50 mg by mouth daily.   Yes Shawna Harris  albuterol (VENTOLIN HFA) 108 (90 Base) MCG/ACT inhaler Inhale 2 puffs into the lungs every 4 (four) hours as needed for wheezing or shortness of breath. Patient not taking: No sig reported 08/26/20   [place], Eric J, DO  guaiFENesin-dextromethorphan (ROBITUSSIN DM) 100-10 MG/5ML syrup Take 10 mLs by mouth every 4 (four) hours as needed for cough. Patient not taking: No sig reported 08/26/20   [place], Eric J, DO    Inpatient Medications: Scheduled Meds: . amLODipine  5 mg Oral Daily  . aspirin  81 mg Oral Daily   Or  . aspirin  300 mg Rectal Daily  . atorvastatin  40 mg Oral Daily  .   enoxaparin (LOVENOX) injection  40 mg Subcutaneous Q24H  . fenofibrate  160 mg Oral Daily  . insulin aspart  0-15 Units Subcutaneous TID WC  . insulin aspart  0-5 Units Subcutaneous QHS  . insulin glargine  10 Units Subcutaneous Daily   Continuous Infusions: . sodium chloride 50 mL/hr at 03/10/21 1602   PRN Meds: acetaminophen **OR** acetaminophen (TYLENOL) oral liquid 160 mg/5 mL **OR** acetaminophen, albuterol, hydrALAZINE, senna-docusate  Allergies:   No Known Allergies  Social History:   Social History   Socioeconomic History  . Marital status: Single    Spouse name: Not on  file  . Number of children: Not on file  . Years of education: Not on file  . Highest education level: Not on file  Occupational History  . Not on file  Tobacco Use  . Smoking status: Current Every Day Smoker    Packs/day: 1.00    Years: 28.00    Pack years: 28.00    Types: Cigarettes  . Smokeless tobacco: Never Used  Substance and Sexual Activity  . Alcohol use: Yes    Comment: heavy weekend drinking  . Drug use: No  . Sexual activity: Yes    Birth control/protection: None  Other Topics Concern  . Not on file  Social History Narrative  . Not on file   Social Determinants of Health   Financial Resource Strain: Not on file  Food Insecurity: Not on file  Transportation Needs: Not on file  Physical Activity: Not on file  Stress: Not on file  Social Connections: Not on file  Intimate Partner Violence: Not on file    Family History:    Family History  Problem Relation Age of Onset  . Chronic Renal Failure Father   . Heart failure Father   . Stroke Neg Hx      ROS:  Please see the history of present illness.   All other ROS reviewed and negative.     Physical Exam/Data:   Vitals:   03/10/21 2326 03/11/21 0327 03/11/21 0725 03/11/21 1200  BP: (!) 127/93 (!) 140/92 (!) 135/99 118/82  Pulse: 98 96 93 94  Resp: 17 17 18 18  Temp: 98.2 F (36.8 C) 97.6 F (36.4 C) 98.6 F (37 C) 98.2 F (36.8 C)  TempSrc: Oral Oral Oral Oral  SpO2: 100% 98% 98%     Intake/Output Summary (Last 24 hours) at 03/11/2021 1340 Last data filed at 03/11/2021 1300 Gross per 24 hour  Intake 1653.66 ml  Output --  Net 1653.66 ml   Last 3 Weights 08/26/2020 08/25/2020 11/25/2019  Weight (lbs) 212 lb 15.4 oz 210 lb 221 lb 3.2 oz  Weight (kg) 96.6 kg 95.255 kg 100.336 kg     There is no height or weight on file to calculate BMI.  General:  Well nourished, well developed, in no acute distress HEENT: normal Lymph: no adenopathy Neck: no JVD Endocrine:  No thryomegaly Vascular: No  carotid bruits; FA pulses 2+ bilaterally without bruits  Cardiac:  normal S1, S2; RRR; no murmur  Lungs:  clear to auscultation bilaterally, no wheezing, rhonchi or rales  Abd: soft, nontender, no hepatomegaly  Ext: no edema Musculoskeletal:  No deformities, BUE and BLE strength normal and equal Skin: warm and dry  Neuro:  CNs 2-12 intact, no focal abnormalities noted Psych:  Normal affect   EKG:  The EKG was personally reviewed and demonstrates:  Sr, 100bpm, PVC, nonspecific ST/T wave changes Telemetry:  Telemetry was personally reviewed and   demonstrates:  SR, HR 90-110, PVCs  Relevant CV Studies:  Echo 03/10/21 1. Left ventricular ejection fraction, by estimation, is 60 to 65%. The  left ventricle has normal function. The left ventricle has no regional  wall motion abnormalities. There is mild left ventricular hypertrophy.  Left ventricular diastolic parameters  were normal.  2. Right ventricular systolic function is normal. The right ventricular  size is normal. Tricuspid regurgitation signal is inadequate for assessing  PA pressure.  3. The mitral valve is normal in structure. No evidence of mitral valve  regurgitation.  4. The aortic valve is tricuspid. Aortic valve regurgitation is not  visualized. No aortic stenosis is present.  5. The inferior vena cava is normal in size with greater than 50%  respiratory variability, suggesting right atrial pressure of 3 mmHg.  6. Agitated saline contrast bubble study was negative, technically  difficult study but no evidence of interatrial shunt was seen.    Laboratory Data:  High Sensitivity Troponin:   Recent Labs  Lab 03/10/21 0331 03/10/21 0523  TROPONINIHS 5 5     Chemistry Recent Labs  Lab 03/10/21 0331 03/10/21 0338  NA 131* 134*  K 3.3* 3.4*  CL 101 103  CO2 21*  --   GLUCOSE 296* 307*  BUN 8 8  CREATININE 0.64 0.40*  CALCIUM 9.0  --   GFRNONAA >60  --   ANIONGAP 9  --     Recent Labs  Lab  03/10/21 0331  PROT 6.2*  ALBUMIN 3.5  AST 18  ALT 14  ALKPHOS 61  BILITOT 0.8   Hematology Recent Labs  Lab 03/10/21 0331 03/10/21 0338  WBC 9.6  --   RBC 4.65  --   HGB 14.0 13.9  HCT 40.6 41.0  MCV 87.3  --   MCH 30.1  --   MCHC 34.5  --   RDW 13.6  --   PLT 351  --    BNPNo results for input(s): BNP, PROBNP in the last 168 hours.  DDimer  Recent Labs  Lab 03/11/21 0825  DDIMER 0.53*     Radiology/Studies:  CT Angio Head W or Wo Contrast  Result Date: 03/10/2021 CLINICAL DATA:  45-year-old female code stroke presentation. Left side symptoms. EXAM: CT ANGIOGRAPHY HEAD AND NECK TECHNIQUE: Multidetector CT imaging of the head and neck was performed using the standard protocol during bolus administration of intravenous contrast. Multiplanar CT image reconstructions and MIPs were obtained to evaluate the vascular anatomy. Carotid stenosis measurements (when applicable) are obtained utilizing NASCET criteria, using the distal internal carotid diameter as the denominator. CONTRAST:  75mL OMNIPAQUE IOHEXOL 350 MG/ML SOLN COMPARISON:  Head CT 0336 hours today.  CTA chest 08/25/2020. FINDINGS: CTA NECK Skeleton: Scattered periapical dental lucency on the left. No superimposed acute osseous abnormality. Evidence of degenerative cervical spine disc bulging at C5-C6. Upper chest: Small benign-appearing pneumatocyst in the left lung on series 5, image 171. Otherwise negative. Other neck: Negative. Aortic arch: 3 vessel arch configuration.  No arch atherosclerosis. Right carotid system: Negative. Mildly tortuous cervical right ICA distal to the bulb. Left carotid system: Negative. Vertebral arteries: Proximal right subclavian artery and right vertebral artery origin appear normal. There is mild right paravertebral venous contrast contamination limiting detail of the proximal right V2 segment, but the visible cervical right vertebral artery is patent and normal to the skull base. Proximal left  subclavian artery and cervical left vertebral arteries are normal. CTA HEAD Posterior circulation: Codominant V4 segments with mild   tortuosity but no plaque or stenosis to the vertebrobasilar junction. Normal PICA origins. Patent basilar artery without stenosis. Patent SCA and PCA origins. Posterior communicating arteries are diminutive or absent. Bilateral PCA branches are within normal limits. Anterior circulation: Both ICA siphons are patent. No siphon plaque or stenosis. Carotid termini, MCA and ACA origins appear normal. Normal anterior communicating artery. Bilateral ACA branches are within normal limits. Left MCA M1 segment, bifurcation, and left MCA branches are within normal limits. Right MCA M1 segment, bifurcation, and right MCA branches are within normal limits. No right MCA branch occlusion identified. Venous sinuses: Early contrast timing. Superior sagittal sinus appears to be patent. Anatomic variants: None. Review of the MIP images confirms the above findings IMPRESSION: Negative for large vessel occlusion, and negative CTA head and neck. No atherosclerosis or stenosis. Electronically Signed   By: H  Hall M.D.   On: 03/10/2021 04:59   DG Chest 1 View  Result Date: 03/10/2021 CLINICAL DATA:  Stroke, dyspnea EXAM: CHEST  1 VIEW COMPARISON:  08/25/2020 FINDINGS: The heart size and mediastinal contours are within normal limits. Both lungs are clear. The visualized skeletal structures are unremarkable. IMPRESSION: No active disease. Electronically Signed   By: Ashesh  Parikh [facility]   On: 03/10/2021 03:56   CT Angio Neck W and/or Wo Contrast  Result Date: 03/10/2021 CLINICAL DATA:  45-year-old female code stroke presentation. Left side symptoms. EXAM: CT ANGIOGRAPHY HEAD AND NECK TECHNIQUE: Multidetector CT imaging of the head and neck was performed using the standard protocol during bolus administration of intravenous contrast. Multiplanar CT image reconstructions and MIPs were obtained to evaluate  the vascular anatomy. Carotid stenosis measurements (when applicable) are obtained utilizing NASCET criteria, using the distal internal carotid diameter as the denominator. CONTRAST:  75mL OMNIPAQUE IOHEXOL 350 MG/ML SOLN COMPARISON:  Head CT 0336 hours today.  CTA chest 08/25/2020. FINDINGS: CTA NECK Skeleton: Scattered periapical dental lucency on the left. No superimposed acute osseous abnormality. Evidence of degenerative cervical spine disc bulging at C5-C6. Upper chest: Small benign-appearing pneumatocyst in the left lung on series 5, image 171. Otherwise negative. Other neck: Negative. Aortic arch: 3 vessel arch configuration.  No arch atherosclerosis. Right carotid system: Negative. Mildly tortuous cervical right ICA distal to the bulb. Left carotid system: Negative. Vertebral arteries: Proximal right subclavian artery and right vertebral artery origin appear normal. There is mild right paravertebral venous contrast contamination limiting detail of the proximal right V2 segment, but the visible cervical right vertebral artery is patent and normal to the skull base. Proximal left subclavian artery and cervical left vertebral arteries are normal. CTA HEAD Posterior circulation: Codominant V4 segments with mild tortuosity but no plaque or stenosis to the vertebrobasilar junction. Normal PICA origins. Patent basilar artery without stenosis. Patent SCA and PCA origins. Posterior communicating arteries are diminutive or absent. Bilateral PCA branches are within normal limits. Anterior circulation: Both ICA siphons are patent. No siphon plaque or stenosis. Carotid termini, MCA and ACA origins appear normal. Normal anterior communicating artery. Bilateral ACA branches are within normal limits. Left MCA M1 segment, bifurcation, and left MCA branches are within normal limits. Right MCA M1 segment, bifurcation, and right MCA branches are within normal limits. No right MCA branch occlusion identified. Venous sinuses:  Early contrast timing. Superior sagittal sinus appears to be patent. Anatomic variants: None. Review of the MIP images confirms the above findings IMPRESSION: Negative for large vessel occlusion, and negative CTA head and neck. No atherosclerosis or stenosis. Electronically Signed   By:   H  Hall M.D.   On: 03/10/2021 04:59   MR BRAIN WO CONTRAST  Result Date: 03/10/2021 CLINICAL DATA:  Left-sided numbness EXAM: MRI HEAD WITHOUT CONTRAST TECHNIQUE: Multiplanar, multiecho pulse sequences of the brain and surrounding structures were obtained without intravenous contrast. COMPARISON:  None. FINDINGS: Brain: There is no acute infarction or intracranial hemorrhage. There is no intracranial mass, mass effect, or edema. There is no hydrocephalus or extra-axial fluid collection. Ventricles and sulci are normal in size and configuration. Vascular: Major vessel flow voids at the skull base are preserved. Skull and upper cervical spine: Normal marrow signal is preserved. Sinuses/Orbits: Paranasal sinuses are aerated. Orbits are unremarkable. Other: 9 mm T2 hyperintense lesion of the pituitary. Mastoid air cells are clear. IMPRESSION: No evidence of recent infarction, hemorrhage, or mass. Subcentimeter T2 hyperintense lesion of the pituitary likely reflects an incidental cyst. A cystic adenoma is much less likely. Electronically Signed   By: Praneil  Patel M.D.   On: 03/10/2021 09:50   MR CERVICAL SPINE WO CONTRAST  Result Date: 03/10/2021 CLINICAL DATA:  Cervical radiculopathy, left cervicalgia with left upper extremity numbness EXAM: MRI CERVICAL SPINE WITHOUT CONTRAST TECHNIQUE: Multiplanar, multisequence MR imaging of the cervical spine was performed. No intravenous contrast was administered. COMPARISON:  None. FINDINGS: Alignment: Preserved. Vertebrae: Vertebral body heights are maintained. No substantial marrow edema. No suspicious osseous lesion. Cord: No abnormal signal. Posterior Fossa, vertebral arteries,  paraspinal tissues: Unremarkable. Disc levels: Intervertebral disc heights and signal are maintained. No disc herniation. Small endplate osteophytes eccentric to the left at C4-C5 and C5-C6. No significant canal or foraminal stenosis at any level. IMPRESSION: No abnormal cord signal. No disc herniation or significant degenerative stenosis. Electronically Signed   By: Praneil  Patel M.D.   On: 03/10/2021 12:42   ECHOCARDIOGRAM COMPLETE BUBBLE STUDY  Result Date: 03/10/2021    ECHOCARDIOGRAM REPORT   Patient Name:   Temperence E Voiles Date of Exam: 03/10/2021 Medical Rec #:  [id number]          Height:       65.0 in Accession #:    [id number]         Weight:       213.0 lb Date of Birth:  09/26/1976          BSA:          2.032 m Patient Age:    45 years           BP:           122/89 mmHg Patient Gender: F                  HR:           99 bpm. Exam Location:  Inpatient Procedure: 2D Echo, Cardiac Doppler and Color Doppler Indications:    Stroke  History:        Patient has no prior history of Echocardiogram examinations.                 Risk Factors:Hypertension and Diabetes.  Sonographer:    John Mendel Brown Referring Phys: [id number] SALMAN KHALIQDINA IMPRESSIONS  1. Left ventricular ejection fraction, by estimation, is 60 to 65%. The left ventricle has normal function. The left ventricle has no regional wall motion abnormalities. There is mild left ventricular hypertrophy. Left ventricular diastolic parameters were normal.  2. Right ventricular systolic function is normal. The right ventricular size is normal. Tricuspid regurgitation signal is inadequate for assessing PA pressure.  3. The   mitral valve is normal in structure. No evidence of mitral valve regurgitation.  4. The aortic valve is tricuspid. Aortic valve regurgitation is not visualized. No aortic stenosis is present.  5. The inferior vena cava is normal in size with greater than 50% respiratory variability, suggesting right atrial pressure of 3 mmHg.   6. Agitated saline contrast bubble study was negative, technically difficult study but no evidence of interatrial shunt was seen. FINDINGS  Left Ventricle: Left ventricular ejection fraction, by estimation, is 60 to 65%. The left ventricle has normal function. The left ventricle has no regional wall motion abnormalities. The left ventricular internal cavity size was normal in size. There is  mild left ventricular hypertrophy. Left ventricular diastolic parameters were normal. Right Ventricle: The right ventricular size is normal. No increase in right ventricular wall thickness. Right ventricular systolic function is normal. Tricuspid regurgitation signal is inadequate for assessing PA pressure. Left Atrium: Left atrial size was normal in size. Right Atrium: Right atrial size was normal in size. Pericardium: There is no evidence of pericardial effusion. Mitral Valve: The mitral valve is normal in structure. No evidence of mitral valve regurgitation. Tricuspid Valve: The tricuspid valve is normal in structure. Tricuspid valve regurgitation is trivial. Aortic Valve: The aortic valve is tricuspid. Aortic valve regurgitation is not visualized. No aortic stenosis is present. Aortic valve mean gradient measures 4.0 mmHg. Aortic valve peak gradient measures 6.7 mmHg. Aortic valve area, by VTI measures 2.37 cm. Pulmonic Valve: The pulmonic valve was not well visualized. Pulmonic valve regurgitation is not visualized. Aorta: The aortic root and ascending aorta are structurally normal, with no evidence of dilitation. Venous: The inferior vena cava is normal in size with greater than 50% respiratory variability, suggesting right atrial pressure of 3 mmHg. IAS/Shunts: No atrial level shunt detected by color flow Doppler. Agitated saline contrast was given intravenously to evaluate for intracardiac shunting. Agitated saline contrast bubble study was negative, with no evidence of any interatrial shunt.  LEFT VENTRICLE PLAX 2D  LVIDd:         4.40 cm     Diastology LVIDs:         3.00 cm     LV e' medial:    6.96 cm/s LV PW:         1.10 cm     LV E/e' medial:  13.0 LV IVS:        1.00 cm     LV e' lateral:   9.14 cm/s LVOT diam:     1.90 cm     LV E/e' lateral: 9.9 LV SV:         52 LV SV Index:   25 LVOT Area:     2.84 cm  LV Volumes (MOD) LV vol d, MOD A2C: 79.8 ml LV vol d, MOD A4C: 81.1 ml LV vol s, MOD A2C: 33.6 ml LV vol s, MOD A4C: 33.5 ml LV SV MOD A2C:     46.2 ml LV SV MOD A4C:     81.1 ml LV SV MOD BP:      48.6 ml RIGHT VENTRICLE            IVC RV Basal diam:  2.70 cm    IVC diam: 1.10 cm RV S prime:     8.16 cm/s TAPSE (M-mode): 2.4 cm LEFT ATRIUM             Index       RIGHT ATRIUM             Index LA diam:        3.30 cm 1.62 cm/m  RA Area:     14.20 cm LA Vol (A2C):   34.5 ml 16.98 ml/m RA Volume:   33.70 ml  16.59 ml/m LA Vol (A4C):   37.8 ml 18.61 ml/m LA Biplane Vol: 36.4 ml 17.92 ml/m  AORTIC VALVE AV Area (Vmax):    2.37 cm AV Area (Vmean):   2.47 cm AV Area (VTI):     2.37 cm AV Vmax:           129.00 cm/s AV Vmean:          88.800 cm/s AV VTI:            0.218 m AV Peak Grad:      6.7 mmHg AV Mean Grad:      4.0 mmHg LVOT Vmax:         108.00 cm/s LVOT Vmean:        77.500 cm/s LVOT VTI:          0.182 m LVOT/AV VTI ratio: 0.83  AORTA Ao Root diam: 3.00 cm Ao Asc diam:  2.90 cm MITRAL VALVE MV Area (PHT): 4.12 cm    SHUNTS MV Decel Time: 184 msec    Systemic VTI:  0.18 m MV E velocity: 90.30 cm/s  Systemic Diam: 1.90 cm MV A velocity: 84.20 cm/s MV E/A ratio:  1.07 Christopher Schumann [facility] Electronically signed by Christopher Schumann [facility] Signature Date/Time: 03/10/2021/1:41:19 PM    Final    CT HEAD CODE STROKE WO CONTRAST  Result Date: 03/10/2021 CLINICAL DATA:  Code stroke. Initial evaluation for acute left-sided numbness. Do EXAM: CT HEAD WITHOUT CONTRAST TECHNIQUE: Contiguous axial images were obtained from the base of the skull through the vertex without intravenous contrast. COMPARISON:  None.  FINDINGS: Brain: Cerebral volume within normal limits. No acute intracranial hemorrhage. No acute large vessel territory infarct. No mass lesion, midline shift or mass effect. No hydrocephalus or extra-axial fluid collection. Vascular: No hyperdense vessel. Skull: Scalp soft tissues and calvarium within normal limits. Sinuses/Orbits: Globes orbital soft tissues within normal limits. Paranasal sinuses are clear. No mastoid effusion. Other: None. ASPECTS ([place] Stroke Program Early CT Score) - Ganglionic level infarction (caudate, lentiform nuclei, internal capsule, insula, M1-M3 cortex): 7 - Supraganglionic infarction (M4-M6 cortex): 3 Total score (0-10 with 10 being normal): 10 IMPRESSION: 1. Negative head CT.  No acute intracranial abnormality identified. 2. ASPECTS is 10. These results were communicated to Dr. Khaliqdina At 3:44 amon 4/27/2022by text page via the AMION messaging system. Electronically Signed   By: Benjamin  McClintock M.D.   On: 03/10/2021 03:46     Assessment and Plan:   Left sided numbness/tingling -  imaging head was unremarkable - seen by neurology - Aspirin started - MRI head negative - Echo - PT/OT  Chest pain - HS trop negative  - EKG with Sr, 100bpm, and no significant ST/T wave changes - patient is reporting exertional chest pain with SOB - Has RF for CAD including DM, HTN, HLD, tobacco use - Echo showed LVEF 60-65%, mild LVH - continue aspirin - continue fenofibrate and atorvastatin - Start metoprolol 12.5mg BID - After discussion with patient plan for cardiac cath Risks and benefits of cardiac catheterization have been discussed with the patient.  These include bleeding, infection, kidney damage, stroke, heart attack, death.  The patient understands these risks and is willing to proceed.   HTN - home amlodipine resumed - start BB As   above  HLD - LDL 36 - TG 1553, with uncontrolled DM - continue statin and fenofibrate  DM2 - A1C 11.2 - management  per primary team  For questions or updates, please contact [facility] Please consult [email or web address] for contact info under    Signed, Cadence H Furth, PA-C  03/11/2021 1:40 PM   Personally seen and examined. Agree with above.   45-year-old female with uncontrolled diabetes hypertension tobacco use who has been experiencing exertional chest discomfort relieved with rest with symptoms of shortness of breath as well here for the evaluation of cardiac symptoms.  Troponin has been normal.  EKG with nonspecific ST-T wave changes.  She has had some left-sided numbness and tingling previously seen by neurology MRI of head was unremarkable.  Heart rate on ECG was around 100 bpm  Echocardiogram unremarkable with EF 60 to 65%.  Triglycerides 1500 in the setting of her uncontrolled diabetes.  Assessment and plan:  Exertional chest discomfort in the setting of uncontrolled diabetes hypertension tobacco use hypertriglyceridemia - We will go ahead and proceed tomorrow with cardiac catheterization, radial artery approach.  She is willing to proceed.  Risks and benefits including stroke heart attack death renal impairment bleeding have been discussed.  Continue to modify risk factors such as diabetes with hemoglobin A1c of 11.2.  Will Schier, Harris   

## 2021-03-11 NOTE — Progress Notes (Signed)
Physical Therapy Treatment Patient Details Name: Shawna Harris MRN: [id number] DOB: 03/02/1976 Today's Date: 03/11/2021    History of Present Illness Pt is a 45 y/o female admitted 4/27 secondary to left neck and chest pain with numbness on the left side extending from her lip to her left arm and her left leg. MRI negative for acute infarct. Pending cervical MRI. Pt also reporting dizziness. PMH includes HTN and DM.    PT Comments    Pt remains limited by chest pain and dizziness during session. Pt's reported symptoms and PT vestibular assessment both align with BPPV (vestibular assessment listed below in general comments), however Epley maneuver unsuccessful in significantly improving symptoms. Pt is able to ambulate for increased distances, with tolerance for dynamic gait tasks limited by chest pain. Pt will benefit from continued acute PT services to improve activity tolerance and to aide in reducing dizziness. PT recommends discharge home with outpatient vestibular PT.   Follow Up Recommendations  Outpatient PT (outpatient vestibular)     Equipment Recommendations  None recommended by PT    Recommendations for Other Services       Precautions / Restrictions Precautions Precautions: Fall Restrictions Weight Bearing Restrictions: No    Mobility  Bed Mobility Overal bed mobility: Needs Assistance Bed Mobility: Rolling;Supine to Sit;Sit to Supine Rolling: Supervision   Supine to sit: Supervision;Min guard Sit to supine: Supervision   General bed mobility comments: increased time, use of rails when dizzy    Transfers Overall transfer level: Needs assistance Equipment used: 1 person hand held assist Transfers: Sit to/from Stand Sit to Stand: Min guard         General transfer comment: hand hold to steady initially  Ambulation/Gait Ambulation/Gait assistance: Min guard (close supervision) Gait Distance (Feet): 150 Feet Assistive device: None Gait  Pattern/deviations: Step-through pattern Gait velocity: reduced Gait velocity interpretation: <1.8 ft/sec, indicate of risk for recurrent falls General Gait Details: pt with slowed step-through gait, able to perform head turns with slight increase in drift. Tolerance limited by reports of chest pain   Stairs             Wheelchair Mobility    Modified Rankin (Stroke Patients Only)       Balance Overall balance assessment: Needs assistance Sitting-balance support: No upper extremity supported;Feet supported Sitting balance-Leahy Scale: Good     Standing balance support: No upper extremity supported Standing balance-Leahy Scale: Fair                              Cognition Arousal/Alertness: Awake/alert Behavior During Therapy: WFL for tasks assessed/performed Overall Cognitive Status: Within Functional Limits for tasks assessed                                        Exercises      General Comments General comments (skin integrity, edema, etc.): tachy up to 131, limited by chest pain and dizziness. Vestibular Assessment: pursuits and saccades negative for symptoms or nystagmus. VOR horizontal + for dizziness, VOR vertical + with less severe symptoms. Head impulse test R +, dix halpike R +, no nystagmus noted during session. Epley maneuver without significant relief of symptoms.      Pertinent Vitals/Pain Pain Assessment: Faces Faces Pain Scale: Hurts even more Pain Location: chest Pain Descriptors / Indicators: Tightness Pain Intervention(s): Limited activity within patient's tolerance  Home Living                      Prior Function            PT Goals (current goals can now be found in the care plan section) Acute Rehab PT Goals Patient Stated Goal: to figure out what is going on Progress towards PT goals: Progressing toward goals    Frequency    Min 3X/week      PT Plan Current plan remains appropriate     Co-evaluation              AM-PAC PT "6 Clicks" Mobility   Outcome Measure  Help needed turning from your back to your side while in a flat bed without using bedrails?: None Help needed moving from lying on your back to sitting on the side of a flat bed without using bedrails?: A Little Help needed moving to and from a bed to a chair (including a wheelchair)?: A Little Help needed standing up from a chair using your arms (e.g., wheelchair or bedside chair)?: A Little Help needed to walk in hospital room?: A Little Help needed climbing 3-5 steps with a railing? : A Lot 6 Click Score: 18    End of Session   Activity Tolerance: Treatment limited secondary to medical complications (Comment);Patient limited by pain (chest pain) Patient left: in bed;with call bell/phone within reach;with bed alarm set Nurse Communication: Mobility status PT Visit Diagnosis: Other symptoms and signs involving the nervous system (I95.188)     Time: 0830-0900 PT Time Calculation (min) (ACUTE ONLY): 30 min  Charges:  $Therapeutic Activity: 8-22 mins $Canalith Rep Proc: 8-22 mins                     Arlyss Gandy, PT, DPT Acute Rehabilitation Pager: [phone]    Arlyss Gandy 03/11/2021, 9:48 AM

## 2021-03-12 ENCOUNTER — Other Ambulatory Visit (HOSPITAL_COMMUNITY): Payer: Self-pay

## 2021-03-12 ENCOUNTER — Ambulatory Visit (HOSPITAL_COMMUNITY)
Admission: EM | Disposition: A | Discharge status: Home / Self Care | Payer: Self-pay | Source: Home / Self Care | Attending: Emergency Medicine

## 2021-03-12 ENCOUNTER — Encounter (HOSPITAL_COMMUNITY): Payer: Self-pay | Admitting: Interventional Cardiology

## 2021-03-12 DIAGNOSIS — R200 Anesthesia of skin: Secondary | ICD-10-CM | POA: Diagnosis not present

## 2021-03-12 DIAGNOSIS — I10 Essential (primary) hypertension: Secondary | ICD-10-CM | POA: Diagnosis not present

## 2021-03-12 DIAGNOSIS — R079 Chest pain, unspecified: Secondary | ICD-10-CM

## 2021-03-12 DIAGNOSIS — R202 Paresthesia of skin: Secondary | ICD-10-CM | POA: Diagnosis not present

## 2021-03-12 DIAGNOSIS — R0789 Other chest pain: Secondary | ICD-10-CM

## 2021-03-12 HISTORY — PX: LEFT HEART CATH AND CORONARY ANGIOGRAPHY: CATH118249

## 2021-03-12 LAB — GLUCOSE, CAPILLARY
Glucose-Capillary: 220 mg/dL — ABNORMAL HIGH (ref 70–99)
Glucose-Capillary: 162 mg/dL — ABNORMAL HIGH (ref 70–99)

## 2021-03-12 LAB — CBC
HCT: 38 % (ref 36.0–46.0)
Hemoglobin: 13 g/dL (ref 12.0–15.0)
MCH: 29.4 pg (ref 26.0–34.0)
MCHC: 34.2 g/dL (ref 30.0–36.0)
MCV: 86 fL (ref 80.0–100.0)
Platelets: 335 10*3/uL (ref 150–400)
RBC: 4.42 MIL/uL (ref 3.87–5.11)
RDW: 13.2 % (ref 11.5–15.5)
WBC: 8.6 10*3/uL (ref 4.0–10.5)
nRBC: 0 % (ref 0.0–0.2)

## 2021-03-12 LAB — CREATININE, SERUM
Creatinine, Ser: 0.52 mg/dL (ref 0.44–1.00)
GFR, Estimated: >60 mL/min (ref >60)

## 2021-03-12 SURGERY — LEFT HEART CATH AND CORONARY ANGIOGRAPHY
Anesthesia: LOCAL

## 2021-03-12 MED ORDER — FREESTYLE LIBRE SENSOR SYSTEM MISC: 1.0000 | Freq: Every day | 3 refills | Status: AC

## 2021-03-12 MED ORDER — VERAPAMIL HCL 2.5 MG/ML IV SOLN
INTRAVENOUS | Status: DC | PRN
  Administered 2021-03-12: 10 mL via INTRA_ARTERIAL

## 2021-03-12 MED ORDER — SODIUM CHLORIDE 0.9% FLUSH: 3.0000 mL | Freq: Two times a day (BID) | INTRAVENOUS | Status: DC

## 2021-03-12 MED ORDER — SODIUM CHLORIDE 0.9% FLUSH: 3.0000 mL | INTRAVENOUS | Status: DC | PRN

## 2021-03-12 MED ORDER — FENTANYL CITRATE (PF) 100 MCG/2ML IJ SOLN
INTRAMUSCULAR | Status: AC
  Filled 2021-03-12: qty 2

## 2021-03-12 MED ORDER — INSULIN GLARGINE 100 UNIT/ML ~~LOC~~ SOLN: 20.0000 [IU] | Freq: Every day | SUBCUTANEOUS | 11 refills | Status: AC

## 2021-03-12 MED ORDER — HEPARIN SODIUM (PORCINE) 1000 UNIT/ML IJ SOLN
INTRAMUSCULAR | Status: AC
  Filled 2021-03-12: qty 1

## 2021-03-12 MED ORDER — HEPARIN (PORCINE) IN NACL 1000-0.9 UT/500ML-% IV SOLN
INTRAVENOUS | Status: DC | PRN
  Administered 2021-03-12 (×2): 500 mL

## 2021-03-12 MED ORDER — SODIUM CHLORIDE 0.9 % IV SOLN: INTRAVENOUS | Status: AC

## 2021-03-12 MED ORDER — INSULIN REGULAR HUMAN 100 UNIT/ML IJ SOLN: INTRAMUSCULAR | 3 refills | Status: DC

## 2021-03-12 MED ORDER — IOHEXOL 350 MG/ML SOLN
INTRAVENOUS | Status: DC | PRN
  Administered 2021-03-12: 90 mL

## 2021-03-12 MED ORDER — OXYCODONE HCL 5 MG PO TABS: 5.0000 mg | ORAL_TABLET | ORAL | Status: DC | PRN

## 2021-03-12 MED ORDER — HEPARIN (PORCINE) IN NACL 1000-0.9 UT/500ML-% IV SOLN
INTRAVENOUS | Status: AC
  Filled 2021-03-12: qty 1000

## 2021-03-12 MED ORDER — LIDOCAINE HCL (PF) 1 % IJ SOLN
INTRAMUSCULAR | Status: AC
  Filled 2021-03-12: qty 30

## 2021-03-12 MED ORDER — NITROGLYCERIN 1 MG/10 ML FOR IR/CATH LAB
INTRA_ARTERIAL | Status: AC
  Filled 2021-03-12: qty 10

## 2021-03-12 MED ORDER — METOPROLOL SUCCINATE ER 25 MG PO TB24: 25.0000 mg | ORAL_TABLET | Freq: Every day | ORAL | 1 refills | Status: AC

## 2021-03-12 MED ORDER — HEPARIN SODIUM (PORCINE) 5000 UNIT/ML IJ SOLN: 5000.0000 [IU] | Freq: Three times a day (TID) | INTRAMUSCULAR | Status: DC

## 2021-03-12 MED ORDER — ATORVASTATIN CALCIUM 40 MG PO TABS: 40.0000 mg | ORAL_TABLET | Freq: Every day | ORAL | 1 refills | Status: AC

## 2021-03-12 MED ORDER — FENOFIBRATE 160 MG PO TABS: 160.0000 mg | ORAL_TABLET | Freq: Every day | ORAL | 1 refills | Status: AC

## 2021-03-12 MED ORDER — INSULIN LISPRO 100 UNIT/ML ~~LOC~~ SOLN: SUBCUTANEOUS | 3 refills | Status: DC

## 2021-03-12 MED ORDER — HYDRALAZINE HCL 20 MG/ML IJ SOLN: 10.0000 mg | INTRAMUSCULAR | Status: DC | PRN

## 2021-03-12 MED ORDER — SODIUM CHLORIDE 0.9 % IV SOLN: 250.0000 mL | INTRAVENOUS | Status: DC | PRN

## 2021-03-12 MED ORDER — LABETALOL HCL 5 MG/ML IV SOLN: 10.0000 mg | INTRAVENOUS | Status: DC | PRN

## 2021-03-12 MED ORDER — INSULIN LISPRO 100 UNIT/ML IJ SOLN
INTRAMUSCULAR | 3 refills | Status: AC
  Filled 2021-03-12: qty 10, 28d supply, fill #0

## 2021-03-12 MED ORDER — MIDAZOLAM HCL 2 MG/2ML IJ SOLN
INTRAMUSCULAR | Status: DC | PRN
  Administered 2021-03-12: 1 mg via INTRAVENOUS

## 2021-03-12 MED ORDER — ENSURE MAX PROTEIN PO LIQD
11.0000 [oz_av] | Freq: Two times a day (BID) | ORAL | Status: DC
  Filled 2021-03-12: qty 330

## 2021-03-12 MED ORDER — MIDAZOLAM HCL 2 MG/2ML IJ SOLN
INTRAMUSCULAR | Status: AC
  Filled 2021-03-12: qty 2

## 2021-03-12 MED ORDER — ADULT MULTIVITAMIN W/MINERALS CH: 1.0000 | ORAL_TABLET | Freq: Every day | ORAL | Status: DC

## 2021-03-12 MED ORDER — ASPIRIN 81 MG PO CHEW: 81.0000 mg | CHEWABLE_TABLET | Freq: Every day | ORAL | Status: DC

## 2021-03-12 MED ORDER — HEPARIN SODIUM (PORCINE) 1000 UNIT/ML IJ SOLN
INTRAMUSCULAR | Status: DC | PRN
  Administered 2021-03-12: 5000 [IU] via INTRAVENOUS

## 2021-03-12 MED ORDER — ACETAMINOPHEN 325 MG PO TABS: 650.0000 mg | ORAL_TABLET | ORAL | Status: DC | PRN

## 2021-03-12 MED ORDER — LIDOCAINE HCL (PF) 1 % IJ SOLN
INTRAMUSCULAR | Status: DC | PRN
Start: 2021-03-12 — End: 2021-03-12
  Administered 2021-03-12: 2 mL

## 2021-03-12 MED ORDER — PROSOURCE PLUS PO LIQD: 30.0000 mL | Freq: Two times a day (BID) | ORAL | Status: DC

## 2021-03-12 MED ORDER — FENTANYL CITRATE (PF) 100 MCG/2ML IJ SOLN
INTRAMUSCULAR | Status: DC | PRN
  Administered 2021-03-12: 50 ug via INTRAVENOUS

## 2021-03-12 MED ORDER — VERAPAMIL HCL 2.5 MG/ML IV SOLN
INTRAVENOUS | Status: AC
  Filled 2021-03-12: qty 2

## 2021-03-12 MED ORDER — "BD INSULIN SYRINGE U/F 30G X 1/2"" 0.5 ML MISC": 1.0000 | Freq: Four times a day (QID) | 1 refills | Status: AC

## 2021-03-12 MED ORDER — ASPIRIN 81 MG PO CHEW: 81.0000 mg | CHEWABLE_TABLET | Freq: Every day | ORAL | 1 refills | Status: AC

## 2021-03-12 MED ORDER — ONDANSETRON HCL 4 MG/2ML IJ SOLN: 4.0000 mg | Freq: Four times a day (QID) | INTRAMUSCULAR | Status: DC | PRN

## 2021-03-12 SURGICAL SUPPLY — 11 items
CATH 5FR JL3.5 JR4 ANG PIG MP (CATHETERS) ×1 IMPLANT
CATH LAUNCHER 5F EBU3.0 (CATHETERS) IMPLANT
CATHETER LAUNCHER 5F EBU3.0 (CATHETERS) ×2
DEVICE RAD COMP TR BAND LRG (VASCULAR PRODUCTS) ×1 IMPLANT
GLIDESHEATH SLEND A-KIT 6F 22G (SHEATH) ×1 IMPLANT
GUIDEWIRE INQWIRE 1.5J.035X260 (WIRE) IMPLANT
INQWIRE 1.5J .035X260CM (WIRE) ×2
KIT HEART LEFT (KITS) ×2 IMPLANT
PACK CARDIAC CATHETERIZATION (CUSTOM PROCEDURE TRAY) ×2 IMPLANT
TRANSDUCER W/STOPCOCK (MISCELLANEOUS) ×2 IMPLANT
TUBING CIL FLEX 10 FLL-RA (TUBING) ×2 IMPLANT

## 2021-03-12 NOTE — Discharge Summary (Addendum)
Physician Discharge Summary  Shawna Harris ZOX:[id number] DOB: 01/07/1976 DOA: 03/10/2021  PCP: Fleet Contras, MD  Admit date: 03/10/2021 Discharge date: 03/12/2021  Admitted From: home Disposition:  home  Recommendations for Outpatient Follow-up:  1. Follow up with PCP in 1-2 weeks 2. Please obtain BMP/CBC in one week  Home Health: none Equipment/Devices: none  Discharge Condition: stable CODE STATUS: Full code Diet recommendation: diabetic   HPI: Per admitting MD, Shawna Harris is a 45 y.o. female with medical history significant of HTN and DM presenting with neurologic symptoms.  She noticed left-sided neck pain radiating into her L shoulder and L jaw and then across her left chest wall.  She noticed numbness/tingling on L side - face, arm, leg.  She has been stumbling while walking recently and is noticing dizzy spells including while turning over in bed.  She is having difficulty getting to the bathroom at times.  Out of nowhere, she gets dizzy and the world starts spinning and she feels like she will fall.  She received Ativan for the MRI, it did not resolve the pain but it helped with her shoulder and arm pain - it is still present but less and now coming back stronger.  Her father died 2 weeks ago and was buried this past weekend and this was unexpected so she has been stressed.    Hospital Course / Discharge diagnoses: Principal Problem Chest pain, left shoulder / jaw / arm pain - mainly with exertion. Troponin negative, 2D echo unremarkable, EKG with non specific changes. Given risk factors cardiology was consulted and followed patient while hospitalized.  She eventually underwent a cardiac catheterization on 4/29 which showed normal coronaries.  Overall she is feeling much better, she was started on aspirin, metoprolol as well as statin, tolerating these medications well and will be continued on discharge  Active Problems L face numbness / tingling - neurology  consulted, MRI/imaging negative for CVA or any other neurological pathology.  Given risk for stroke given smoking history, hyperlipidemia, neurology recommends 81 mg of aspirin on discharge. HLD/hypertriglyceridemia- continue statin and fibrate Tobacco use - counseled for cessation.  She is determined to quit DM - poorly controlled, with hyperglycemia. A1C 11.  She was initially resistant to insulin however she is agreeable, benefits checked by case manager with her insurance and will be discharged on Lantus along with Humalog.  Extensive discussion with the patient at bedside on the day of discharge regarding CBG checks, she will be prescribed CGM, and need for follow-up as an outpatient  Sepsis ruled out   Discharge Instructions  Discharge Instructions    Ambulatory referral to Physical Therapy   Complete by: As directed    Vestibular therapy     Allergies as of 03/12/2021   No Known Allergies     Medication List    STOP taking these medications   amLODipine 5 MG tablet Commonly known as: NORVASC     TAKE these medications   albuterol 108 (90 Base) MCG/ACT inhaler Commonly known as: VENTOLIN HFA Inhale 2 puffs into the lungs every 4 (four) hours as needed for wheezing or shortness of breath.   aspirin 81 MG chewable tablet Chew 1 tablet (81 mg total) by mouth daily. Start taking on: March 13, 2021   atorvastatin 40 MG tablet Commonly known as: LIPITOR Take 1 tablet (40 mg total) by mouth daily. Start taking on: March 13, 2021   BD Insulin Syringe U/F 30G X 1/2" 0.5 ML Misc Generic  drug: Insulin Syringe-Needle U-100 1 each by Does not apply route 4 (four) times daily.   fenofibrate 160 MG tablet Take 1 tablet (160 mg total) by mouth daily. Start taking on: March 13, 2021   FreeStyle Libre Sensor System Misc 1 each by Does not apply route daily.   guaiFENesin-dextromethorphan 100-10 MG/5ML syrup Commonly known as: ROBITUSSIN DM Take 10 mLs by mouth every 4 (four)  hours as needed for cough.   insulin glargine 100 UNIT/ML injection Commonly known as: LANTUS Inject 0.2 mLs (20 Units total) into the skin at bedtime.   insulin lispro 100 UNIT/ML injection Commonly known as: HUMALOG CBG 70 - 120: 0 units CBG 121 - 150: 2 units CBG 151 - 200: 3 units CBG 201 - 250: 5 units CBG 251 - 300: 8 units CBG 301 - 350: 10 units CBG 351 - 400: 12 units   metFORMIN 500 MG tablet Commonly known as: GLUCOPHAGE Take 500 mg by mouth 2 (two) times daily.   metoprolol succinate 25 MG 24 hr tablet Commonly known as: Toprol XL Take 1 tablet (25 mg total) by mouth daily.   multivitamin capsule Take 1 capsule by mouth every morning.   vitamin C 1000 MG tablet Take 1,000 mg by mouth daily.   Vitamin D-3 125 MCG (5000 UT) Tabs Take 5,000 Units by mouth daily.   zinc gluconate 50 MG tablet Take 50 mg by mouth daily.       Follow-up Information    Renaye Rakers, MD Follow up on 03/17/2021.   Specialty: Family Medicine Why: Your appointment is at 10 am but please arrive by 9:30 am. Please bring: picture ID, insurance card, medications in their bottles, covid card if vaccinated.  Contact information: [address] [place] [address] [phone]        [facility] Follow [facility].   Specialty: Rehabilitation Why: Vestibular therapy is at this location. The outpatient rehab will contact you for the first appointment Contact information: [address] [id number] mc [place] [address] [phone]              Consultations:  Neurology   Cardiology   Procedures/Studies:  Cardiac cath 4/29  CT Angio Head W or Wo Contrast  Result Date: 03/10/2021 CLINICAL DATA:  45 year old female code stroke presentation. Left side symptoms. EXAM: CT ANGIOGRAPHY HEAD AND NECK TECHNIQUE: Multidetector CT imaging of the head and neck was performed using the standard protocol during bolus  administration of intravenous contrast. Multiplanar CT image reconstructions and MIPs were obtained to evaluate the vascular anatomy. Carotid stenosis measurements (when applicable) are obtained utilizing NASCET criteria, using the distal internal carotid diameter as the denominator. CONTRAST:  75mL OMNIPAQUE IOHEXOL 350 MG/ML SOLN COMPARISON:  Head CT 0336 hours today.  CTA chest 08/25/2020. FINDINGS: CTA NECK Skeleton: Scattered periapical dental lucency on the left. No superimposed acute osseous abnormality. Evidence of degenerative cervical spine disc bulging at C5-C6. Upper chest: Small benign-appearing pneumatocyst in the left lung on series 5, image 171. Otherwise negative. Other neck: Negative. Aortic arch: 3 vessel arch configuration.  No arch atherosclerosis. Right carotid system: Negative. Mildly tortuous cervical right ICA distal to the bulb. Left carotid system: Negative. Vertebral arteries: Proximal right subclavian artery and right vertebral artery origin appear normal. There is mild right paravertebral venous contrast contamination limiting detail of the proximal right V2 segment, but the visible cervical right vertebral artery is patent and normal to the skull base. Proximal left  subclavian artery and cervical left vertebral arteries are normal. CTA HEAD Posterior circulation: Codominant V4 segments with mild tortuosity but no plaque or stenosis to the vertebrobasilar junction. Normal PICA origins. Patent basilar artery without stenosis. Patent SCA and PCA origins. Posterior communicating arteries are diminutive or absent. Bilateral PCA branches are within normal limits. Anterior circulation: Both ICA siphons are patent. No siphon plaque or stenosis. Carotid termini, MCA and ACA origins appear normal. Normal anterior communicating artery. Bilateral ACA branches are within normal limits. Left MCA M1 segment, bifurcation, and left MCA branches are within normal limits. Right MCA M1 segment,  bifurcation, and right MCA branches are within normal limits. No right MCA branch occlusion identified. Venous sinuses: Early contrast timing. Superior sagittal sinus appears to be patent. Anatomic variants: None. Review of the MIP images confirms the above findings IMPRESSION: Negative for large vessel occlusion, and negative CTA head and neck. No atherosclerosis or stenosis. Electronically Signed   By: Odessa Fleming M.D.   On: 03/10/2021 04:59   DG Chest 1 View  Result Date: 03/10/2021 CLINICAL DATA:  Stroke, dyspnea EXAM: CHEST  1 VIEW COMPARISON:  08/25/2020 FINDINGS: The heart size and mediastinal contours are within normal limits. Both lungs are clear. The visualized skeletal structures are unremarkable. IMPRESSION: No active disease. Electronically Signed   By: Helyn Numbers MD   On: 03/10/2021 03:56   CT Angio Neck W and/or Wo Contrast  Result Date: 03/10/2021 CLINICAL DATA:  45 year old female code stroke presentation. Left side symptoms. EXAM: CT ANGIOGRAPHY HEAD AND NECK TECHNIQUE: Multidetector CT imaging of the head and neck was performed using the standard protocol during bolus administration of intravenous contrast. Multiplanar CT image reconstructions and MIPs were obtained to evaluate the vascular anatomy. Carotid stenosis measurements (when applicable) are obtained utilizing NASCET criteria, using the distal internal carotid diameter as the denominator. CONTRAST:  35mL OMNIPAQUE IOHEXOL 350 MG/ML SOLN COMPARISON:  Head CT 0336 hours today.  CTA chest 08/25/2020. FINDINGS: CTA NECK Skeleton: Scattered periapical dental lucency on the left. No superimposed acute osseous abnormality. Evidence of degenerative cervical spine disc bulging at C5-C6. Upper chest: Small benign-appearing pneumatocyst in the left lung on series 5, image 171. Otherwise negative. Other neck: Negative. Aortic arch: 3 vessel arch configuration.  No arch atherosclerosis. Right carotid system: Negative. Mildly tortuous cervical  right ICA distal to the bulb. Left carotid system: Negative. Vertebral arteries: Proximal right subclavian artery and right vertebral artery origin appear normal. There is mild right paravertebral venous contrast contamination limiting detail of the proximal right V2 segment, but the visible cervical right vertebral artery is patent and normal to the skull base. Proximal left subclavian artery and cervical left vertebral arteries are normal. CTA HEAD Posterior circulation: Codominant V4 segments with mild tortuosity but no plaque or stenosis to the vertebrobasilar junction. Normal PICA origins. Patent basilar artery without stenosis. Patent SCA and PCA origins. Posterior communicating arteries are diminutive or absent. Bilateral PCA branches are within normal limits. Anterior circulation: Both ICA siphons are patent. No siphon plaque or stenosis. Carotid termini, MCA and ACA origins appear normal. Normal anterior communicating artery. Bilateral ACA branches are within normal limits. Left MCA M1 segment, bifurcation, and left MCA branches are within normal limits. Right MCA M1 segment, bifurcation, and right MCA branches are within normal limits. No right MCA branch occlusion identified. Venous sinuses: Early contrast timing. Superior sagittal sinus appears to be patent. Anatomic variants: None. Review of the MIP images confirms the above findings IMPRESSION: Negative for  large vessel occlusion, and negative CTA head and neck. No atherosclerosis or stenosis. Electronically Signed   By: Odessa Fleming M.D.   On: 03/10/2021 04:59   MR BRAIN WO CONTRAST  Result Date: 03/10/2021 CLINICAL DATA:  Left-sided numbness EXAM: MRI HEAD WITHOUT CONTRAST TECHNIQUE: Multiplanar, multiecho pulse sequences of the brain and surrounding structures were obtained without intravenous contrast. COMPARISON:  None. FINDINGS: Brain: There is no acute infarction or intracranial hemorrhage. There is no intracranial mass, mass effect, or edema.  There is no hydrocephalus or extra-axial fluid collection. Ventricles and sulci are normal in size and configuration. Vascular: Major vessel flow voids at the skull base are preserved. Skull and upper cervical spine: Normal marrow signal is preserved. Sinuses/Orbits: Paranasal sinuses are aerated. Orbits are unremarkable. Other: 9 mm T2 hyperintense lesion of the pituitary. Mastoid air cells are clear. IMPRESSION: No evidence of recent infarction, hemorrhage, or mass. Subcentimeter T2 hyperintense lesion of the pituitary likely reflects an incidental cyst. A cystic adenoma is much less likely. Electronically Signed   By: Guadlupe Spanish M.D.   On: 03/10/2021 09:50   MR CERVICAL SPINE WO CONTRAST  Result Date: 03/10/2021 CLINICAL DATA:  Cervical radiculopathy, left cervicalgia with left upper extremity numbness EXAM: MRI CERVICAL SPINE WITHOUT CONTRAST TECHNIQUE: Multiplanar, multisequence MR imaging of the cervical spine was performed. No intravenous contrast was administered. COMPARISON:  None. FINDINGS: Alignment: Preserved. Vertebrae: Vertebral body heights are maintained. No substantial marrow edema. No suspicious osseous lesion. Cord: No abnormal signal. Posterior Fossa, vertebral arteries, paraspinal tissues: Unremarkable. Disc levels: Intervertebral disc heights and signal are maintained. No disc herniation. Small endplate osteophytes eccentric to the left at C4-C5 and C5-C6. No significant canal or foraminal stenosis at any level. IMPRESSION: No abnormal cord signal. No disc herniation or significant degenerative stenosis. Electronically Signed   By: Guadlupe Spanish M.D.   On: 03/10/2021 12:42   CARDIAC CATHETERIZATION  Result Date: 03/12/2021  Normal coronary arteries.  Right dominant coronary anatomy.  Low normal left ventricular systolic function with EF 55%.  Normal LVEDP less than 15 mmHg. RECOMMENDATIONS:  Normal coronary arteries without luminal irregularities or evidence of atherosclerosis.   Risk factor modification to prevent future disease.  ECHOCARDIOGRAM COMPLETE BUBBLE STUDY  Result Date: 03/10/2021    ECHOCARDIOGRAM REPORT   Patient Name:   RAYLEI LOSURDO Date of Exam: 03/10/2021 Medical Rec #:  [id number]          Height:       65.0 in Accession #:    [id number]         Weight:       213.0 lb Date of Birth:  04/05/1976          BSA:          2.032 m Patient Age:    45 years           BP:           122/89 mmHg Patient Gender: F                  HR:           99 bpm. Exam Location:  Inpatient Procedure: 2D Echo, Cardiac Doppler and Color Doppler Indications:    Stroke  History:        Patient has no prior history of Echocardiogram examinations.                 Risk Factors:Hypertension and Diabetes.  Sonographer:    Renard Matter  Brown Referring Phys: [id number] [facility] IMPRESSIONS  1. Left ventricular ejection fraction, by estimation, is 60 to 65%. The left ventricle has normal function. The left ventricle has no regional wall motion abnormalities. There is mild left ventricular hypertrophy. Left ventricular diastolic parameters were normal.  2. Right ventricular systolic function is normal. The right ventricular size is normal. Tricuspid regurgitation signal is inadequate for assessing PA pressure.  3. The mitral valve is normal in structure. No evidence of mitral valve regurgitation.  4. The aortic valve is tricuspid. Aortic valve regurgitation is not visualized. No aortic stenosis is present.  5. The inferior vena cava is normal in size with greater than 50% respiratory variability, suggesting right atrial pressure of 3 mmHg.  6. Agitated saline contrast bubble study was negative, technically difficult study but no evidence of interatrial shunt was seen. FINDINGS  Left Ventricle: Left ventricular ejection fraction, by estimation, is 60 to 65%. The left ventricle has normal function. The left ventricle has no regional wall motion abnormalities. The left ventricular internal cavity  size was normal in size. There is  mild left ventricular hypertrophy. Left ventricular diastolic parameters were normal. Right Ventricle: The right ventricular size is normal. No increase in right ventricular wall thickness. Right ventricular systolic function is normal. Tricuspid regurgitation signal is inadequate for assessing PA pressure. Left Atrium: Left atrial size was normal in size. Right Atrium: Right atrial size was normal in size. Pericardium: There is no evidence of pericardial effusion. Mitral Valve: The mitral valve is normal in structure. No evidence of mitral valve regurgitation. Tricuspid Valve: The tricuspid valve is normal in structure. Tricuspid valve regurgitation is trivial. Aortic Valve: The aortic valve is tricuspid. Aortic valve regurgitation is not visualized. No aortic stenosis is present. Aortic valve mean gradient measures 4.0 mmHg. Aortic valve peak gradient measures 6.7 mmHg. Aortic valve area, by VTI measures 2.37 cm. Pulmonic Valve: The pulmonic valve was not well visualized. Pulmonic valve regurgitation is not visualized. Aorta: The aortic root and ascending aorta are structurally normal, with no evidence of dilitation. Venous: The inferior vena cava is normal in size with greater than 50% respiratory variability, suggesting right atrial pressure of 3 mmHg. IAS/Shunts: No atrial level shunt detected by color flow Doppler. Agitated saline contrast was given intravenously to evaluate for intracardiac shunting. Agitated saline contrast bubble study was negative, with no evidence of any interatrial shunt.  LEFT VENTRICLE PLAX 2D LVIDd:         4.40 cm     Diastology LVIDs:         3.00 cm     LV e' medial:    6.96 cm/s LV PW:         1.10 cm     LV E/e' medial:  13.0 LV IVS:        1.00 cm     LV e' lateral:   9.14 cm/s LVOT diam:     1.90 cm     LV E/e' lateral: 9.9 LV SV:         52 LV SV Index:   25 LVOT Area:     2.84 cm  LV Volumes (MOD) LV vol d, MOD A2C: 79.8 ml LV vol d, MOD  A4C: 81.1 ml LV vol s, MOD A2C: 33.6 ml LV vol s, MOD A4C: 33.5 ml LV SV MOD A2C:     46.2 ml LV SV MOD A4C:     81.1 ml LV SV MOD BP:  48.6 ml RIGHT VENTRICLE            IVC RV Basal diam:  2.70 cm    IVC diam: 1.10 cm RV S prime:     8.16 cm/s TAPSE (M-mode): 2.4 cm LEFT ATRIUM             Index       RIGHT ATRIUM           Index LA diam:        3.30 cm 1.62 cm/m  RA Area:     14.20 cm LA Vol (A2C):   34.5 ml 16.98 ml/m RA Volume:   33.70 ml  16.59 ml/m LA Vol (A4C):   37.8 ml 18.61 ml/m LA Biplane Vol: 36.4 ml 17.92 ml/m  AORTIC VALVE AV Area (Vmax):    2.37 cm AV Area (Vmean):   2.47 cm AV Area (VTI):     2.37 cm AV Vmax:           129.00 cm/s AV Vmean:          88.800 cm/s AV VTI:            0.218 m AV Peak Grad:      6.7 mmHg AV Mean Grad:      4.0 mmHg LVOT Vmax:         108.00 cm/s LVOT Vmean:        77.500 cm/s LVOT VTI:          0.182 m LVOT/AV VTI ratio: 0.83  AORTA Ao Root diam: 3.00 cm Ao Asc diam:  2.90 cm MITRAL VALVE MV Area (PHT): 4.12 cm    SHUNTS MV Decel Time: 184 msec    Systemic VTI:  0.18 m MV E velocity: 90.30 cm/s  Systemic Diam: 1.90 cm MV A velocity: 84.20 cm/s MV E/A ratio:  1.07 Epifanio Lesches MD Electronically signed by Epifanio Lesches MD Signature Date/Time: 03/10/2021/1:41:19 PM    Final    CT HEAD CODE STROKE WO CONTRAST  Result Date: 03/10/2021 CLINICAL DATA:  Code stroke. Initial evaluation for acute left-sided numbness. Do EXAM: CT HEAD WITHOUT CONTRAST TECHNIQUE: Contiguous axial images were obtained from the base of the skull through the vertex without intravenous contrast. COMPARISON:  None. FINDINGS: Brain: Cerebral volume within normal limits. No acute intracranial hemorrhage. No acute large vessel territory infarct. No mass lesion, midline shift or mass effect. No hydrocephalus or extra-axial fluid collection. Vascular: No hyperdense vessel. Skull: Scalp soft tissues and calvarium within normal limits. Sinuses/Orbits: Globes orbital soft tissues  within normal limits. Paranasal sinuses are clear. No mastoid effusion. Other: None. ASPECTS [facility] Stroke Program Early CT Score) - Ganglionic level infarction (caudate, lentiform nuclei, internal capsule, insula, M1-M3 cortex): 7 - Supraganglionic infarction (M4-M6 cortex): 3 Total score (0-10 with 10 being normal): 10 IMPRESSION: 1. Negative head CT.  No acute intracranial abnormality identified. 2. ASPECTS is 10. These results were communicated to Dr. Derry Lory At 3:44 amon 4/27/2022by text page via the [facility] messaging system. Electronically Signed   By: Rise Mu M.D.   On: 03/10/2021 03:46     Subjective: - no chest pain, shortness of breath, no abdominal pain, nausea or vomiting.   Discharge Exam: BP (!) 132/92   Pulse 77   Temp 98 F (36.7 C) (Oral)   Resp 13   Ht 5\' 5"  (1.651 m)   Wt 97.5 kg   SpO2 99%   BMI 35.77 kg/m   General: Pt is alert, awake, not in  acute distress Cardiovascular: RRR, S1/S2 +, no rubs, no gallops Respiratory: CTA bilaterally, no wheezing, no rhonchi Abdominal: Soft, NT, ND, bowel sounds + Extremities: no edema, no cyanosis    The results of significant diagnostics from this hospitalization (including imaging, microbiology, ancillary and laboratory) are listed below for reference.     Microbiology: Recent Results (from the past 240 hour(s))  Resp Panel by RT-PCR (Flu A&B, Covid) Nasopharyngeal Swab     Status: None   Collection Time: 03/10/21  5:02 AM   Specimen: Nasopharyngeal Swab; Nasopharyngeal(NP) swabs in vial transport medium  Result Value Ref Range Status   SARS Coronavirus 2 by RT PCR NEGATIVE NEGATIVE Final    Comment: (NOTE) SARS-CoV-2 target nucleic acids are NOT DETECTED.  The SARS-CoV-2 RNA is generally detectable in upper respiratory specimens during the acute phase of infection. The lowest concentration of SARS-CoV-2 viral copies this assay can detect is 138 copies/mL. A negative result does not preclude  SARS-Cov-2 infection and should not be used as the sole basis for treatment or other patient management decisions. A negative result may occur with  improper specimen collection/handling, submission of specimen other than nasopharyngeal swab, presence of viral mutation(s) within the areas targeted by this assay, and inadequate number of viral copies(<138 copies/mL). A negative result must be combined with clinical observations, patient history, and epidemiological information. The expected result is Negative.  Fact Sheet for Patients:  BloggerCourse.com  Fact Sheet for Healthcare Providers:  SeriousBroker.it  This test is no t yet approved or cleared by the [place] FDA and  has been authorized for detection and/or diagnosis of SARS-CoV-2 by FDA under an Emergency Use Authorization (EUA). This EUA will remain  in effect (meaning this test can be used) for the duration of the COVID-19 declaration under Section 564(b)(1) of the Act, 21 U.S.C.section 360bbb-3(b)(1), unless the authorization is terminated  or revoked sooner.       Influenza A by PCR NEGATIVE NEGATIVE Final   Influenza B by PCR NEGATIVE NEGATIVE Final    Comment: (NOTE) The Xpert Xpress SARS-CoV-2/FLU/RSV plus assay is intended as an aid in the diagnosis of influenza from Nasopharyngeal swab specimens and should not be used as a sole basis for treatment. Nasal washings and aspirates are unacceptable for Xpert Xpress SARS-CoV-2/FLU/RSV testing.  Fact Sheet for Patients: BloggerCourse.com  Fact Sheet for Healthcare Providers: SeriousBroker.it  This test is not yet approved or cleared by the [place] FDA and has been authorized for detection and/or diagnosis of SARS-CoV-2 by FDA under an Emergency Use Authorization (EUA). This EUA will remain in effect (meaning this test can be used) for the duration of  the COVID-19 declaration under Section 564(b)(1) of the Act, 21 U.S.C. section 360bbb-3(b)(1), unless the authorization is terminated or revoked.  Performed at [facility], [address], [place], [place] [address]      Labs: Basic Metabolic Panel: Recent Labs  Lab 03/10/21 0331 03/10/21 0338  NA 131* 134*  K 3.3* 3.4*  CL 101 103  CO2 21*  --   GLUCOSE 296* 307*  BUN 8 8  CREATININE 0.64 0.40*  CALCIUM 9.0  --    Liver Function Tests: Recent Labs  Lab 03/10/21 0331  AST 18  ALT 14  ALKPHOS 61  BILITOT 0.8  PROT 6.2*  ALBUMIN 3.5   CBC: Recent Labs  Lab 03/10/21 0331 03/10/21 0338  WBC 9.6  --   NEUTROABS 5.8  --   HGB 14.0 13.9  HCT  40.6 41.0  MCV 87.3  --   PLT 351  --    CBG: Recent Labs  Lab 03/11/21 1637 03/11/21 2128 03/11/21 2237 03/12/21 0618 03/12/21 1146  GLUCAP 282* 341* 289* 220* 162*   Hgb A1c Recent Labs    03/10/21 0522  HGBA1C 11.2*   Lipid Profile Recent Labs    03/10/21 0523 03/10/21 1052  CHOL 274*  --   HDL NOT REPORTED DUE TO HIGH TRIGLYCERIDES  --   LDLCALC NOT CALCULATED  --   TRIG 1,553*  --   CHOLHDL NOT REPORTED DUE TO HIGH TRIGLYCERIDES  --   LDLDIRECT  --  36.6   Thyroid function studies Recent Labs    03/11/21 0825  TSH 1.327   Urinalysis    Component Value Date/Time   COLORURINE AMBER (A) 03/10/2021 1023   APPEARANCEUR HAZY (A) 03/10/2021 1023   LABSPEC >1.046 (H) 03/10/2021 1023   PHURINE 6.0 03/10/2021 1023   GLUCOSEU 50 (A) 03/10/2021 1023   HGBUR LARGE (A) 03/10/2021 1023   BILIRUBINUR NEGATIVE 03/10/2021 1023   KETONESUR 5 (A) 03/10/2021 1023   PROTEINUR 100 (A) 03/10/2021 1023   UROBILINOGEN 1.0 03/15/2015 1415   NITRITE NEGATIVE 03/10/2021 1023   LEUKOCYTESUR NEGATIVE 03/10/2021 1023    FURTHER DISCHARGE INSTRUCTIONS:   Get Medicines reviewed and adjusted: Please take all your medications with you for your next visit with your Primary MD   Laboratory/radiological  data: Please request your Primary MD to go over all hospital tests and procedure/radiological results at the follow up, please ask your Primary MD to get all Hospital records sent to his/her office.   In some cases, they will be blood work, cultures and biopsy results pending at the time of your discharge. Please request that your primary care M.D. goes through all the records of your hospital data and follows up on these results.   Also Note the following: If you experience worsening of your admission symptoms, develop shortness of breath, life threatening emergency, suicidal or homicidal thoughts you must seek medical attention immediately by calling 911 or calling your MD immediately  if symptoms less severe.   You must read complete instructions/literature along with all the possible adverse reactions/side effects for all the Medicines you take and that have been prescribed to you. Take any new Medicines after you have completely understood and accpet all the possible adverse reactions/side effects.    Do not drive when taking Pain medications or sleeping medications (Benzodaizepines)   Do not take more than prescribed Pain, Sleep and Anxiety Medications. It is not advisable to combine anxiety,sleep and pain medications without talking with your primary care practitioner   Special Instructions: If you have smoked or chewed Tobacco  in the last 2 yrs please stop smoking, stop any regular Alcohol  and or any Recreational drug use.   Wear Seat belts while driving.   Please note: You were cared for by a hospitalist during your hospital stay. Once you are discharged, your primary care physician will handle any further medical issues. Please note that NO REFILLS for any discharge medications will be authorized once you are discharged, as it is imperative that you return to your primary care physician (or establish a relationship with a primary care physician if you do not have one) for your post  hospital discharge needs so that they can reassess your need for medications and monitor your lab values.  Time coordinating discharge: 40 minutes  SIGNED:  Pamella Pert, MD, PhD  03/12/2021, 12:10 PM

## 2021-03-12 NOTE — TOC Transition Note (Signed)
Transition of Care [facility]) - CM/SW Discharge Note   Patient Details  Name: Shawna Harris MRN: [id number] Date of Birth: 11-19-1975  Transition of [facility]) CM/SW Contact:  Kermit Balo, RN Phone Number: 03/12/2021, 12:19 PM   Clinical Narrative:    Patient is discharging home with spouse. PCP and outpatient rehab information on the AVS. CM provided her coupons for Lantus and Humalog to assist with her copays.  Pt has transportation home.   Final next level of care: OP Rehab Barriers to Discharge: No Barriers Identified   Patient Goals and CMS Choice     Choice offered to / list presented to : Patient  Discharge Placement                       Discharge Plan and Services                                     Social Determinants of Health (SDOH) Interventions     Readmission Risk Interventions No flowsheet data found.

## 2021-03-12 NOTE — Progress Notes (Addendum)
Progress Note  Patient Name: Shawna BottomsKendrice E Harris Date of Encounter: 03/12/2021  [facility]: New-Dr. Anne FuSkains  Subjective   Plan for cardiac catheterization today. Patient reports dull chest pain. No SOB.   Inpatient Medications    Scheduled Meds: . amLODipine  5 mg Oral Daily  . aspirin  81 mg Oral Daily   Or  . aspirin  300 mg Rectal Daily  . atorvastatin  40 mg Oral Daily  . enoxaparin (LOVENOX) injection  40 mg Subcutaneous Q24H  . fenofibrate  160 mg Oral Daily  . insulin aspart  0-15 Units Subcutaneous TID WC  . insulin aspart  0-5 Units Subcutaneous QHS  . insulin glargine  10 Units Subcutaneous BID  . metoprolol tartrate  12.5 mg Oral BID  . sodium chloride flush  3 mL Intravenous Q12H   Continuous Infusions: . sodium chloride 50 mL/hr at 03/12/21 0600  . sodium chloride    . sodium chloride     PRN Meds: sodium chloride, acetaminophen **OR** acetaminophen (TYLENOL) oral liquid 160 mg/5 mL **OR** acetaminophen, albuterol, hydrALAZINE, senna-docusate, sodium chloride flush   Vital Signs    Vitals:   03/11/21 2111 03/11/21 2348 03/12/21 0452 03/12/21 0700  BP: 129/86 126/82 105/76 (!) 130/94  Pulse: (!) 104 96 83 80  Resp: 18 18 18 18   Temp: 97.9 F (36.6 C) (!) 97.5 F (36.4 C) (!) 97.5 F (36.4 C) 98 F (36.7 C)  TempSrc: Oral Oral Oral Oral  SpO2: 100% 100% 100% 100%  Weight: 97.5 kg     Height: 5\' 5"  (1.651 m)       Intake/Output Summary (Last 24 hours) at 03/12/2021 0830 Last data filed at 03/11/2021 1900 Gross per 24 hour  Intake 300.17 ml  Output --  Net 300.17 ml   Last 3 Weights 03/11/2021 08/26/2020 08/25/2020  Weight (lbs) 214 lb 15.2 oz 212 lb 15.4 oz 210 lb  Weight (kg) 97.5 kg 96.6 kg 95.255 kg      Telemetry    NSR, HR 70-80s - Personally Reviewed  ECG     No new- Personally Reviewed  Physical Exam   GEN: No acute distress.   Neck: No JVD Cardiac: RRR, no murmurs, rubs, or gallops.  Respiratory: Clear to  auscultation bilaterally. GI: Soft, nontender, non-distended  MS: No edema; No deformity. Neuro:  Nonfocal  Psych: Normal affect   Labs    High Sensitivity Troponin:   Recent Labs  Lab 03/10/21 0331 03/10/21 0523  TROPONINIHS 5 5      Chemistry Recent Labs  Lab 03/10/21 0331 03/10/21 0338  NA 131* 134*  K 3.3* 3.4*  CL 101 103  CO2 21*  --   GLUCOSE 296* 307*  BUN 8 8  CREATININE 0.64 0.40*  CALCIUM 9.0  --   PROT 6.2*  --   ALBUMIN 3.5  --   AST 18  --   ALT 14  --   ALKPHOS 61  --   BILITOT 0.8  --   GFRNONAA >60  --   ANIONGAP 9  --      Hematology Recent Labs  Lab 03/10/21 0331 03/10/21 0338  WBC 9.6  --   RBC 4.65  --   HGB 14.0 13.9  HCT 40.6 41.0  MCV 87.3  --   MCH 30.1  --   MCHC 34.5  --   RDW 13.6  --   PLT 351  --     BNPNo results for input(s): BNP,  PROBNP in the last 168 hours.   DDimer  Recent Labs  Lab 03/11/21 0825  DDIMER 0.53*     Radiology    MR BRAIN WO CONTRAST  Result Date: 03/10/2021 CLINICAL DATA:  Left-sided numbness EXAM: MRI HEAD WITHOUT CONTRAST TECHNIQUE: Multiplanar, multiecho pulse sequences of the brain and surrounding structures were obtained without intravenous contrast. COMPARISON:  None. FINDINGS: Brain: There is no acute infarction or intracranial hemorrhage. There is no intracranial mass, mass effect, or edema. There is no hydrocephalus or extra-axial fluid collection. Ventricles and sulci are normal in size and configuration. Vascular: Major vessel flow voids at the skull base are preserved. Skull and upper cervical spine: Normal marrow signal is preserved. Sinuses/Orbits: Paranasal sinuses are aerated. Orbits are unremarkable. Other: 9 mm T2 hyperintense lesion of the pituitary. Mastoid air cells are clear. IMPRESSION: No evidence of recent infarction, hemorrhage, or mass. Subcentimeter T2 hyperintense lesion of the pituitary likely reflects an incidental cyst. A cystic adenoma is much less likely.  Electronically Signed   By: Guadlupe Spanish M.D.   On: 03/10/2021 09:50   MR CERVICAL SPINE WO CONTRAST  Result Date: 03/10/2021 CLINICAL DATA:  Cervical radiculopathy, left cervicalgia with left upper extremity numbness EXAM: MRI CERVICAL SPINE WITHOUT CONTRAST TECHNIQUE: Multiplanar, multisequence MR imaging of the cervical spine was performed. No intravenous contrast was administered. COMPARISON:  None. FINDINGS: Alignment: Preserved. Vertebrae: Vertebral body heights are maintained. No substantial marrow edema. No suspicious osseous lesion. Cord: No abnormal signal. Posterior Fossa, vertebral arteries, paraspinal tissues: Unremarkable. Disc levels: Intervertebral disc heights and signal are maintained. No disc herniation. Small endplate osteophytes eccentric to the left at C4-C5 and C5-C6. No significant canal or foraminal stenosis at any level. IMPRESSION: No abnormal cord signal. No disc herniation or significant degenerative stenosis. Electronically Signed   By: Guadlupe Spanish M.D.   On: 03/10/2021 12:42   ECHOCARDIOGRAM COMPLETE BUBBLE STUDY  Result Date: 03/10/2021    ECHOCARDIOGRAM REPORT   Patient Name:   Shawna Harris Date of Exam: 03/10/2021 Medical Rec #:  [id number]          Height:       65.0 in Accession #:    [id number]         Weight:       213.0 lb Date of Birth:  1976/01/03          BSA:          2.032 m Patient Age:    45 years           BP:           122/89 mmHg Patient Gender: F                  HR:           99 bpm. Exam Location:  Inpatient Procedure: 2D Echo, Cardiac Doppler and Color Doppler Indications:    Stroke  History:        Patient has no prior history of Echocardiogram examinations.                 Risk Factors:Hypertension and Diabetes.  Sonographer:    Shirlean Kelly Referring Phys: [id number] [facility] IMPRESSIONS  1. Left ventricular ejection fraction, by estimation, is 60 to 65%. The left ventricle has normal function. The left ventricle has no regional wall  motion abnormalities. There is mild left ventricular hypertrophy. Left ventricular diastolic parameters were normal.  2. Right ventricular systolic function is normal. The  right ventricular size is normal. Tricuspid regurgitation signal is inadequate for assessing PA pressure.  3. The mitral valve is normal in structure. No evidence of mitral valve regurgitation.  4. The aortic valve is tricuspid. Aortic valve regurgitation is not visualized. No aortic stenosis is present.  5. The inferior vena cava is normal in size with greater than 50% respiratory variability, suggesting right atrial pressure of 3 mmHg.  6. Agitated saline contrast bubble study was negative, technically difficult study but no evidence of interatrial shunt was seen. FINDINGS  Left Ventricle: Left ventricular ejection fraction, by estimation, is 60 to 65%. The left ventricle has normal function. The left ventricle has no regional wall motion abnormalities. The left ventricular internal cavity size was normal in size. There is  mild left ventricular hypertrophy. Left ventricular diastolic parameters were normal. Right Ventricle: The right ventricular size is normal. No increase in right ventricular wall thickness. Right ventricular systolic function is normal. Tricuspid regurgitation signal is inadequate for assessing PA pressure. Left Atrium: Left atrial size was normal in size. Right Atrium: Right atrial size was normal in size. Pericardium: There is no evidence of pericardial effusion. Mitral Valve: The mitral valve is normal in structure. No evidence of mitral valve regurgitation. Tricuspid Valve: The tricuspid valve is normal in structure. Tricuspid valve regurgitation is trivial. Aortic Valve: The aortic valve is tricuspid. Aortic valve regurgitation is not visualized. No aortic stenosis is present. Aortic valve mean gradient measures 4.0 mmHg. Aortic valve peak gradient measures 6.7 mmHg. Aortic valve area, by VTI measures 2.37 cm. Pulmonic  Valve: The pulmonic valve was not well visualized. Pulmonic valve regurgitation is not visualized. Aorta: The aortic root and ascending aorta are structurally normal, with no evidence of dilitation. Venous: The inferior vena cava is normal in size with greater than 50% respiratory variability, suggesting right atrial pressure of 3 mmHg. IAS/Shunts: No atrial level shunt detected by color flow Doppler. Agitated saline contrast was given intravenously to evaluate for intracardiac shunting. Agitated saline contrast bubble study was negative, with no evidence of any interatrial shunt.  LEFT VENTRICLE PLAX 2D LVIDd:         4.40 cm     Diastology LVIDs:         3.00 cm     LV e' medial:    6.96 cm/s LV PW:         1.10 cm     LV E/e' medial:  13.0 LV IVS:        1.00 cm     LV e' lateral:   9.14 cm/s LVOT diam:     1.90 cm     LV E/e' lateral: 9.9 LV SV:         52 LV SV Index:   25 LVOT Area:     2.84 cm  LV Volumes (MOD) LV vol d, MOD A2C: 79.8 ml LV vol d, MOD A4C: 81.1 ml LV vol s, MOD A2C: 33.6 ml LV vol s, MOD A4C: 33.5 ml LV SV MOD A2C:     46.2 ml LV SV MOD A4C:     81.1 ml LV SV MOD BP:      48.6 ml RIGHT VENTRICLE            IVC RV Basal diam:  2.70 cm    IVC diam: 1.10 cm RV S prime:     8.16 cm/s TAPSE (M-mode): 2.4 cm LEFT ATRIUM  Index       RIGHT ATRIUM           Index LA diam:        3.30 cm 1.62 cm/m  RA Area:     14.20 cm LA Vol (A2C):   34.5 ml 16.98 ml/m RA Volume:   33.70 ml  16.59 ml/m LA Vol (A4C):   37.8 ml 18.61 ml/m LA Biplane Vol: 36.4 ml 17.92 ml/m  AORTIC VALVE AV Area (Vmax):    2.37 cm AV Area (Vmean):   2.47 cm AV Area (VTI):     2.37 cm AV Vmax:           129.00 cm/s AV Vmean:          88.800 cm/s AV VTI:            0.218 m AV Peak Grad:      6.7 mmHg AV Mean Grad:      4.0 mmHg LVOT Vmax:         108.00 cm/s LVOT Vmean:        77.500 cm/s LVOT VTI:          0.182 m LVOT/AV VTI ratio: 0.83  AORTA Ao Root diam: 3.00 cm Ao Asc diam:  2.90 cm MITRAL VALVE MV Area (PHT):  4.12 cm    SHUNTS MV Decel Time: 184 msec    Systemic VTI:  0.18 m MV E velocity: 90.30 cm/s  Systemic Diam: 1.90 cm MV A velocity: 84.20 cm/s MV E/A ratio:  1.07 Epifanio Lesches MD Electronically signed by Epifanio Lesches MD Signature Date/Time: 03/10/2021/1:41:19 PM    Final     Cardiac Studies   Echo 03/10/21 1. Left ventricular ejection fraction, by estimation, is 60 to 65%. The  left ventricle has normal function. The left ventricle has no regional  wall motion abnormalities. There is mild left ventricular hypertrophy.  Left ventricular diastolic parameters  were normal.  2. Right ventricular systolic function is normal. The right ventricular  size is normal. Tricuspid regurgitation signal is inadequate for assessing  PA pressure.  3. The mitral valve is normal in structure. No evidence of mitral valve  regurgitation.  4. The aortic valve is tricuspid. Aortic valve regurgitation is not  visualized. No aortic stenosis is present.  5. The inferior vena cava is normal in size with greater than 50%  respiratory variability, suggesting right atrial pressure of 3 mmHg.  6. Agitated saline contrast bubble study was negative, technically  difficult study but no evidence of interatrial shunt was seen.    Patient Profile     45 y.o. female hx of HTN, DM, tobacco ue who is being seen today for the evaluation of chest pain   Assessment & Plan    Left sided numbness/tingling -  imaging head was unremarkable - seen by neurology - Aspirin started - MRI head negative - Echo as above - PT/OT  Chest pain - HS trop negative on admission - EKG with Sr, 100bpm, and no significant ST/T wave changes - patient is reporting exertional chest pain with SOB - Has RF for CAD including DM, HTN, HLD, tobacco use - Echo showed LVEF 60-65%, mild LVH - continue aspirin - continue fenofibrate and atorvastatin - Start metoprolol 12.5mg  BID, BP and heart rates better - After  discussion with patient plan for cardiac cath, further recs per cath  HTN - home amlodipine resumed - start BB as above, pressures improved  HLD - LDL 36 - TG 1553,  with uncontrolled DM - continue statin and fenofibrate  DM2 - A1C 11.2 - management per primary team   For questions or updates, please contact [facility] Please consult [email or web address] for contact info under        Signed, Cadence David Stall, PA-C  03/12/2021, 8:30 AM    Agree with plan.  Patient went to cardiac catheterization lab, reassuring angiogram.  Normal coronary arteries.  Continue with risk factor modification.  She has been discharged from hospitalist service.  Donato Schultz, MD

## 2021-03-12 NOTE — CV Procedure (Signed)
   Normal coronary arteries.  Right dominant.  Normal LV function.  Normal LVEDP.

## 2021-03-12 NOTE — Progress Notes (Signed)
Initial Nutrition Assessment  DOCUMENTATION CODES:   Obesity unspecified  INTERVENTION:  -Ensure Max po BID, each supplement provides 150 kcal and 30 grams of protein -13ml Prosource Plus po BID, each supplement provides 100 kcals and 15 grams of protein -MVI with minerals daily -Provided educational material from the Academy [employer] regarding blood sugar control via diet -- will follow-up on education as able  NUTRITION DIAGNOSIS:   Inadequate oral intake related to decreased appetite as evidenced by meal completion < 50%.  GOAL:   Patient will meet greater than or equal to 90% of their needs  MONITOR:   PO intake,Supplement acceptance,Labs,Weight trends,I & O's  REASON FOR ASSESSMENT:   Consult Assessment of nutrition requirement/status  ASSESSMENT:   Pt with a PMH significant for tobacco use, DM, HTN, HLD presented w/ L sided jaw pain radiating into her chest, L shoulder and L arm. Pt also reported numbness/tingling in the L side of her face in addition to occasional dizzy spells. Work-up ongoing.  Pt continues to endorse pain in chest, L shoulder, jaw, arm. Per MD, troponin negative, 2D echo unremarkable, EKG w/ nonspecific changes, neuro workup negative. Cardiology consulted.   RD consulted to assess pt due to initial concern for possible TIA/CVA; however, as stated above, neuro work-up negative. RD went to assess pt and determine any potential needs, but pt out of room x2 attempts. Per RN, pt in cath lab for cardiac cath. Pt noted to have initially had a poor appetite, but this appears to be improving. Three meal completions charted as 15-75% (40% average intake). Will provide pt with oral nutrition supplements to help pt meet nutrition needs. Will also provide information from the Academy of [employer] regarding blood sugar control given pt's elevated A1c. Will follow-up on education as able.   Reviewed weight history; no significant weight  changes noted.   No UOP documented  Medications: SSI TID w/ meals and at bedtime, 10 units lantus BID Labs (last taken 4/27): Na 134 (L), K+ 3.4 (L)  CBGs 220-341 over the last 24 hours (Diabetes Coordinator following) A1c: 11.2 (H)   Diet Order:   Diet Order            Diet NPO time specified Except for: Sips with Meds  Diet effective midnight                 EDUCATION NEEDS:   Education needs have been addressed  Skin:  Skin Assessment: Reviewed RN Assessment  Last BM:  4/26  Height:   Ht Readings from Last 1 Encounters:  03/11/21 5\' 5"  (1.651 m)    Weight:   Wt Readings from Last 1 Encounters:  03/11/21 97.5 kg    Ideal Body Weight:  56.82 kg  BMI:  Body mass index is 35.77 kg/m.  Estimated Nutritional Needs:   Kcal:  2000-2200  Protein:  100-115g  Fluid:  >2L/d    03/13/21, MS, RD, LDN RD pager number and weekend/on-call pager number located in Amion.

## 2021-03-12 NOTE — Progress Notes (Signed)
Patient given discharge instructions and stated understanding.  Husband is driving her home

## 2021-03-12 NOTE — Progress Notes (Signed)
PT Cancellation Note  Patient Details Name: Shawna Harris MRN: [id number] DOB: May 13, 1976   Cancelled Treatment:    Reason Eval/Treat Not Completed: Patient at procedure or test/unavailable. Pt off unit for cardiac cath. PT will follow up as time allows.   Arlyss Gandy 03/12/2021, 11:22 AM

## 2021-03-12 NOTE — Progress Notes (Signed)
PT Cancellation Note  Patient Details Name: Shawna Harris MRN: [id number] DOB: [birth year]   Cancelled Treatment:    Reason Eval/Treat Not Completed: Other (comment).  Pt on the phone with pharmacy trying to get her home insulin.  Reports she feels confident in current mobility level, knows about referral for OP PT for vestibular therapy at [facility] center and would like to concentrate on getting home today.  PT to check back Monday to make sure she did d/c.  PT to follow acutely until d/c confirmed.  I did give her the contact info for the Neurorehab clinic.   Thanks,  Corinna Capra, PT, DPT  Acute Rehabilitation [phone] pager #[phone] office       Shawna Harris 03/12/2021, 1:10 PM

## 2021-03-12 NOTE — Plan of Care (Signed)
  Problem: Acute Rehab PT Goals(only PT should resolve) Goal: Pt Will Go Supine/Side To Sit Outcome: Adequate for Discharge Goal: Patient Will Transfer Sit To/From Stand Outcome: Adequate for Discharge Goal: Pt Will Ambulate Outcome: Adequate for Discharge Goal: Pt Will Go Up/Down Stairs Outcome: Adequate for Discharge   Problem: Education: Goal: Knowledge of disease or condition will improve Outcome: Adequate for Discharge Goal: Knowledge of secondary prevention will improve Outcome: Adequate for Discharge Goal: Knowledge of patient specific risk factors addressed and post discharge goals established will improve Outcome: Adequate for Discharge Goal: Individualized Educational Video(s) Outcome: Adequate for Discharge   Problem: Coping: Goal: Will verbalize positive feelings about self Outcome: Adequate for Discharge Goal: Will identify appropriate support needs Outcome: Adequate for Discharge   Problem: Health Behavior/Discharge Planning: Goal: Ability to manage health-related needs will improve Outcome: Adequate for Discharge   Problem: Self-Care: Goal: Ability to participate in self-care as condition permits will improve Outcome: Adequate for Discharge Goal: Verbalization of feelings and concerns over difficulty with self-care will improve Outcome: Adequate for Discharge Goal: Ability to communicate needs accurately will improve Outcome: Adequate for Discharge   Problem: Nutrition: Goal: Dietary intake will improve Outcome: Adequate for Discharge   Problem: Inadequate Intake (NI-2.1) Goal: Food and/or nutrient delivery Description: Individualized approach for food/nutrient provision. Outcome: Adequate for Discharge

## 2021-03-12 NOTE — Progress Notes (Signed)
LOCATION:  Right RADIAL  DEFLATED PER PROTOCOL: YES  TIME BAND OFF/DRESSING APPLIED: 1105, gauze with tegaderm  SITE UPON ARRIVAL: LEVEL 0  SITE AFTER BAND REMOVAL: LEVEL 0  CIRCULATION SENSATION AND MOVEMENT: bilateral radial pulses at +2  COMMENTS:

## 2021-03-12 NOTE — TOC Benefit Eligibility Note (Signed)
Transition of Care [facility]) Benefit Eligibility Note    Patient Details  Name: Shawna Harris MRN: [id number] Date of Birth: 1976-08-21   Medication/Dose: Lantus 25 unit SQ daily for 30 day supply  Covered?: Yes  Tier: 2 Drug  Prescription Coverage Preferred Pharmacy: [facility],[place],  Spoke with Person/Company/Phone Number:: Sampson Goon [facility] ,Ph# [phone]  Co-Pay: $33.83 retail pharmacy)  ([facility] zero co-pay)  Prior Approval: No  Deductible: Unmet  Additional Notes: PA required for Levemir and Novolog @ [phone]    Renie Ora Phone Number: 03/12/2021, 9:41 AM

## 2021-03-12 NOTE — Discharge Instructions (Addendum)
Follow with PCP in 1-2 weeks  Please take Lantus 20 units daily and short acting insulin as per the sliding scale below. CBG 70 - 120: 0 units CBG 121 - 150: 2 units CBG 151 - 200: 3 units CBG 201 - 250: 5 units CBG 251 - 300: 8 units CBG 301 - 350: 10 units CBG 351 - 400: 12 units   Please get a complete blood count and chemistry panel checked by your Primary MD at your next visit, and again as instructed by your Primary MD. Please get your medications reviewed and adjusted by your Primary MD.  Please request your Primary MD to go over all Hospital Tests and Procedure/Radiological results at the follow up, please get all Hospital records sent to your Prim MD by signing hospital release before you go home.  In some cases, there will be blood work, cultures and biopsy results pending at the time of your discharge. Please request that your primary care M.D. goes through all the records of your hospital data and follows up on these results.  If you had Pneumonia of Lung problems at the Hospital: Please get a 2 view Chest X ray done in 6-8 weeks after hospital discharge or sooner if instructed by your Primary MD.  If you have Congestive Heart Failure: Please call your Cardiologist or Primary MD anytime you have any of the following symptoms:  1) 3 pound weight gain in 24 hours or 5 pounds in 1 week  2) shortness of breath, with or without a dry hacking cough  3) swelling in the hands, feet or stomach  4) if you have to sleep on extra pillows at night in order to breathe  Follow cardiac low salt diet and 1.5 lit/day fluid restriction.  If you have diabetes Accuchecks 4 times/day, Once in AM empty stomach and then before each meal. Log in all results and show them to your primary doctor at your next visit. If any glucose reading is under 80 or above 300 call your primary MD immediately.  If you have Seizure/Convulsions/Epilepsy: Please do not drive, operate heavy machinery, participate in  activities at heights or participate in high speed sports until you have seen by Primary MD or a Neurologist and advised to do so again. [place], patients with seizures are not allowed to drive until they have been seizure-free for six months.  Use caution when using heavy equipment or power tools. Avoid working on ladders or at heights. Take showers instead of baths. Ensure the water temperature is not too high on the home water heater. Do not go swimming alone. Do not lock yourself in a room alone (i.e. bathroom). When caring for infants or small children, sit down when holding, feeding, or changing them to minimize risk of injury to the child in the event you have a seizure. Maintain good sleep hygiene. Avoid alcohol.   If you had Gastrointestinal Bleeding: Please ask your Primary MD to check a complete blood count within one week of discharge or at your next visit. Your endoscopic/colonoscopic biopsies that are pending at the time of discharge, will also need to followed by your Primary MD.  Get Medicines reviewed and adjusted. Please take all your medications with you for your next visit with your Primary MD  Please request your Primary MD to go over all hospital tests and procedure/radiological results at the follow up, please ask your Primary MD to get all Hospital records sent to his/her office.  If you experience worsening of your admission symptoms, develop shortness of breath, life threatening emergency, suicidal or homicidal thoughts you must seek medical attention immediately by calling 911 or calling your MD immediately  if symptoms less severe.  You must read complete instructions/literature along with all the possible adverse reactions/side effects for all the Medicines you take and that have been prescribed to you. Take any new Medicines after you have completely understood and accpet all the possible adverse reactions/side effects.   Do not drive or operate  heavy machinery when taking Pain medications.   Do not take more than prescribed Pain, Sleep and Anxiety Medications  Special Instructions: If you have smoked or chewed Tobacco  in the last 2 yrs please stop smoking, stop any regular Alcohol  and or any Recreational drug use.  Wear Seat belts while driving.  Please note You were cared for by a hospitalist during your hospital stay. If you have any questions about your discharge medications or the care you received while you were in the hospital after you are discharged, you can call the unit and asked to speak with the hospitalist on call if the hospitalist that took care of you is not available. Once you are discharged, your primary care physician will handle any further medical issues. Please note that NO REFILLS for any discharge medications will be authorized once you are discharged, as it is imperative that you return to your primary care physician (or establish a relationship with a primary care physician if you do not have one) for your aftercare needs so that they can reassess your need for medications and monitor your lab values.  You can reach the hospitalist office at phone [phone] or fax [phone]   If you do not have a primary care physician, you can call [phone] for a physician referral.  Activity: As tolerated with Full fall precautions use walker/cane & assistance as needed    Diet: diabetic  Disposition Home     Carbohydrate Counting For People With Diabetes  Foods with carbohydrates make your blood glucose level go up. Learning how to count carbohydrates can help you control your blood glucose levels. First, identify the foods you eat that contain carbohydrates. Then, using the Foods with Carbohydrates chart, determine about how much carbohydrates are in your meals and snacks. Make sure you are eating foods with fiber, protein, and healthy fat along with your carbohydrate foods. Foods with Carbohydrates The  following table shows carbohydrate foods that have about 15 grams of carbohydrate each. Using measuring cups, spoons, or a food scale when you first begin learning about carbohydrate counting can help you learn about the portion sizes you typically eat. The following foods have 15 grams carbohydrate each:  Grains . 1 slice bread (1 ounce)  . 1 small tortilla (6-inch size)  .  large bagel (1 ounce)  . 1/3 cup pasta or rice (cooked)  .  hamburger or hot dog bun ( ounce)  .  cup cooked cereal  .  to  cup ready-to-eat cereal  . 2 taco shells (5-inch size) Fruit . 1 small fresh fruit ( to 1 cup)  .  medium banana  . 17 small grapes (3 ounces)  . 1 cup melon or berries  .  cup canned or frozen fruit  . 2 tablespoons dried fruit (blueberries, cherries, cranberries, raisins)  .  cup unsweetened fruit juice  Starchy Vegetables .  cup cooked beans, peas, corn, potatoes/sweet potatoes  .  large baked  potato (3 ounces)  . 1 cup acorn or butternut squash  Snack Foods . 3 to 6 crackers  . 8 potato chips or 13 tortilla chips ( ounce to 1 ounce)  . 3 cups popped popcorn  Dairy . 3/4 cup (6 ounces) nonfat plain yogurt, or yogurt with sugar-free sweetener  . 1 cup milk  . 1 cup plain rice, soy, coconut or flavored almond milk Sweets and Desserts .  cup ice cream or frozen yogurt  . 1 tablespoon jam, jelly, pancake syrup, table sugar, or honey  . 2 tablespoons light pancake syrup  . 1 inch square of frosted cake or 2 inch square of unfrosted cake  . 2 small cookies (2/3 ounce each) or  large cookie  Sometimes you'll have to estimate carbohydrate amounts if you don't know the exact recipe. One cup of mixed foods like soups can have 1 to 2 carbohydrate servings, while some casseroles might have 2 or more servings of carbohydrate. Foods that have less than 20 calories in each serving can be counted as "free" foods. Count 1 cup raw vegetables, or  cup cooked non-starchy vegetables as  "free" foods. If you eat 3 or more servings at one meal, then count them as 1 carbohydrate serving.  Foods without Carbohydrates  Not all foods contain carbohydrates. Meat, some dairy, fats, non-starchy vegetables, and many beverages don't contain carbohydrate. So when you count carbohydrates, you can generally exclude chicken, pork, beef, fish, seafood, eggs, tofu, cheese, butter, sour cream, avocado, nuts, seeds, olives, mayonnaise, water, black coffee, unsweetened tea, and zero-calorie drinks. Vegetables with no or low carbohydrate include green beans, cauliflower, tomatoes, and onions. How much carbohydrate should I eat at each meal?  Carbohydrate counting can help you plan your meals and manage your weight. Following are some starting points for carbohydrate intake at each meal. Work with your registered dietitian nutritionist to find the best range that works for your blood glucose and weight.   To Lose Weight To Maintain Weight  Women 2 - 3 carb servings 3 - 4 carb servings  Men 3 - 4 carb servings 4 - 5 carb servings  Checking your blood glucose after meals will help you know if you need to adjust the timing, type, or number of carbohydrate servings in your meal plan. Achieve and keep a healthy body weight by balancing your food intake and physical activity.  Tips How should I plan my meals?  Plan for half the food on your plate to include non-starchy vegetables, like salad greens, broccoli, or carrots. Try to eat 3 to 5 servings of non-starchy vegetables every day. Have a protein food at each meal. Protein foods include chicken, fish, meat, eggs, or beans (note that beans contain carbohydrate). These two food groups (non-starchy vegetables and proteins) are low in carbohydrate. If you fill up your plate with these foods, you will eat less carbohydrate but still fill up your stomach. Try to limit your carbohydrate portion to  of the plate.  What fats are healthiest to eat?  Diabetes increases  risk for heart disease. To help protect your heart, eat more healthy fats, such as olive oil, nuts, and avocado. Eat less saturated fats like butter, cream, and high-fat meats, like bacon and sausage. Avoid trans fats, which are in all foods that list "partially hydrogenated oil" as an ingredient. What should I drink?  Choose drinks that are not sweetened with sugar. The healthiest choices are water, carbonated or seltzer waters, and tea and  coffee without added sugars.  Sweet drinks will make your blood glucose go up very quickly. One serving of soda or energy drink is  cup. It is best to drink these beverages only if your blood glucose is low.  Artificially sweetened, or diet drinks, typically do not increase your blood glucose if they have zero calories in them. Read labels of beverages, as some diet drinks do have carbohydrate and will raise your blood glucose. Label Reading Tips Read Nutrition Facts labels to find out how many grams of carbohydrate are in a food you want to eat. Don't forget: sometimes serving sizes on the label aren't the same as how much food you are going to eat, so you may need to calculate how much carbohydrate is in the food you are serving yourself.   Carbohydrate Counting for People with Diabetes Sample 1-Day Menu  Breakfast  cup yogurt, low fat, low sugar (1 carbohydrate serving)   cup cereal, ready-to-eat, unsweetened (1 carbohydrate serving)  1 cup strawberries (1 carbohydrate serving)   cup almonds ( carbohydrate serving)  Lunch 1, 5 ounce can chunk light tuna  2 ounces cheese, low fat cheddar  6 whole wheat crackers (1 carbohydrate serving)  1 small apple (1 carbohydrate servings)   cup carrots ( carbohydrate serving)   cup snap peas  1 cup 1% milk (1 carbohydrate serving)   Evening Meal Stir fry made with: 3 ounces chicken  1 cup brown rice (3 carbohydrate servings)   cup broccoli ( carbohydrate serving)   cup green beans   cup onions  1  tablespoon olive oil  2 tablespoons teriyaki sauce ( carbohydrate serving)  Evening Snack 1 extra small banana (1 carbohydrate serving)  1 tablespoon peanut butter   Carbohydrate Counting for People with Diabetes Vegan Sample 1-Day Menu  Breakfast 1 cup cooked oatmeal (2 carbohydrate servings)   cup blueberries (1 carbohydrate serving)  2 tablespoons flaxseeds  1 cup soymilk fortified with calcium and vitamin D  1 cup coffee  Lunch 2 slices whole wheat bread (2 carbohydrate servings)   cup baked tofu   cup lettuce  2 slices tomato  2 slices avocado   cup baby carrots ( carbohydrate serving)  1 orange (1 carbohydrate serving)  1 cup soymilk fortified with calcium and vitamin D   Evening Meal Burrito made with: 1 6-inch corn tortilla (1 carbohydrate serving)  1 cup refried vegetarian beans (2 carbohydrate servings)   cup chopped tomatoes   cup lettuce   cup salsa  1/3 cup brown rice (1 carbohydrate serving)  1 tablespoon olive oil for rice   cup zucchini   Evening Snack 6 small whole grain crackers (1 carbohydrate serving)  2 apricots ( carbohydrate serving)   cup unsalted peanuts ( carbohydrate serving)    Carbohydrate Counting for People with Diabetes Vegetarian (Lacto-Ovo) Sample 1-Day Menu  Breakfast 1 cup cooked oatmeal (2 carbohydrate servings)   cup blueberries (1 carbohydrate serving)  2 tablespoons flaxseeds  1 egg  1 cup 1% milk (1 carbohydrate serving)  1 cup coffee  Lunch 2 slices whole wheat bread (2 carbohydrate servings)  2 ounces low-fat cheese   cup lettuce  2 slices tomato  2 slices avocado   cup baby carrots ( carbohydrate serving)  1 orange (1 carbohydrate serving)  1 cup unsweetened tea  Evening Meal Burrito made with: 1 6-inch corn tortilla (1 carbohydrate serving)   cup refried vegetarian beans (1 carbohydrate serving)   cup tomatoes   cup  lettuce   cup salsa  1/3 cup brown rice (1 carbohydrate serving)  1 tablespoon olive  oil for rice   cup zucchini  1 cup 1% milk (1 carbohydrate serving)  Evening Snack 6 small whole grain crackers (1 carbohydrate serving)  2 apricots ( carbohydrate serving)   cup unsalted peanuts ( carbohydrate serving)    Copyright 2020  Academy of [employer]etics. All rights reserved.  Using Nutrition Labels: Carbohydrate  . Serving Size  . Look at the serving size. All the information on the label is based on this portion. Jolyne Loa Per Container  . The number of servings contained in the package. . Guidelines for Carbohydrate  . Look at the total grams of carbohydrate in the serving size.  . 1 carbohydrate choice = 15 grams of carbohydrate. Range of Carbohydrate Grams Per Choice  Carbohydrate Grams/Choice Carbohydrate Choices  6-10   11-20 1  21-25 1  26-35 2  36-40 2  41-50 3  51-55 3  56-65 4  66-70 4  71-80 5    Copyright 2020  Academy of [employer] and Dietetics. All rights reserved.

## 2021-03-12 NOTE — Interval H&P Note (Signed)
Cath Lab Visit (complete for each Cath Lab visit)  Clinical Evaluation Leading to the Procedure:   ACS: No.  Non-ACS:    Anginal Classification: CCS III  Anti-ischemic medical therapy: No Therapy  Non-Invasive Test Results: No non-invasive testing performed  Prior CABG: No previous CABG      History and Physical Interval Note:  03/12/2021 9:03 AM  Shawna Harris  has presented today for surgery, with the diagnosis of positive troponins.  The various methods of treatment have been discussed with the patient and family. After consideration of risks, benefits and other options for treatment, the patient has consented to  Procedure(s): LEFT HEART CATH AND CORONARY ANGIOGRAPHY (N/A) as a surgical intervention.  The patient's history has been reviewed, patient examined, no change in status, stable for surgery.  I have reviewed the patient's chart and labs.  Questions were answered to the patient's satisfaction.     Lyn Records III

## 2021-03-12 NOTE — Progress Notes (Signed)
Benefits check submitted for Lantus, Levemir and Novolog SSI. Awaiting results

## 2021-03-12 NOTE — Progress Notes (Signed)
Note plans for patient to d/c home.  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  Note orders for [facility] sensor for home.  Taught patient how to use [employer] and assisted her with placing first sensor.  Teaching done regarding Use of sensor, application and trouble shooting.  Gave her sample for use at home and MD has given Rx.    Thanks,  Beryl Meager, RN, BC-ADM Inpatient Diabetes Coordinator Pager [phone] (8a-5p)

## 2021-03-14 MED FILL — Nitroglycerin IV Soln 100 MCG/ML in D5W: INTRA_ARTERIAL | Qty: 10 | Status: AC

## 2021-03-18 ENCOUNTER — Ambulatory Visit: Payer: 59 | Admitting: Physical Therapy

## 2021-03-29 ENCOUNTER — Ambulatory Visit: Payer: 59 | Attending: Internal Medicine | Admitting: Physical Therapy

## 2021-09-24 ENCOUNTER — Emergency Department (HOSPITAL_BASED_OUTPATIENT_CLINIC_OR_DEPARTMENT_OTHER): Payer: Self-pay | Admitting: Radiology

## 2021-09-24 ENCOUNTER — Encounter (HOSPITAL_BASED_OUTPATIENT_CLINIC_OR_DEPARTMENT_OTHER): Payer: Self-pay

## 2021-09-24 ENCOUNTER — Emergency Department (HOSPITAL_BASED_OUTPATIENT_CLINIC_OR_DEPARTMENT_OTHER)
Admission: EM | Admit: 2021-09-24 | Discharge: 2021-09-24 | Disposition: A | Discharge status: Home / Self Care | Payer: Self-pay | Attending: Emergency Medicine | Admitting: Emergency Medicine

## 2021-09-24 ENCOUNTER — Other Ambulatory Visit: Payer: Self-pay

## 2021-09-24 ENCOUNTER — Other Ambulatory Visit (HOSPITAL_BASED_OUTPATIENT_CLINIC_OR_DEPARTMENT_OTHER): Payer: Self-pay

## 2021-09-24 DIAGNOSIS — X501XXA Overexertion from prolonged static or awkward postures, initial encounter: Secondary | ICD-10-CM | POA: Insufficient documentation

## 2021-09-24 DIAGNOSIS — Z955 Presence of coronary angioplasty implant and graft: Secondary | ICD-10-CM | POA: Insufficient documentation

## 2021-09-24 DIAGNOSIS — E119 Type 2 diabetes mellitus without complications: Secondary | ICD-10-CM | POA: Insufficient documentation

## 2021-09-24 DIAGNOSIS — F17210 Nicotine dependence, cigarettes, uncomplicated: Secondary | ICD-10-CM | POA: Insufficient documentation

## 2021-09-24 DIAGNOSIS — Z7982 Long term (current) use of aspirin: Secondary | ICD-10-CM | POA: Insufficient documentation

## 2021-09-24 DIAGNOSIS — S93402A Sprain of unspecified ligament of left ankle, initial encounter: Secondary | ICD-10-CM | POA: Insufficient documentation

## 2021-09-24 DIAGNOSIS — Z794 Long term (current) use of insulin: Secondary | ICD-10-CM | POA: Insufficient documentation

## 2021-09-24 DIAGNOSIS — I10 Essential (primary) hypertension: Secondary | ICD-10-CM | POA: Insufficient documentation

## 2021-09-24 DIAGNOSIS — R000 Tachycardia, unspecified: Secondary | ICD-10-CM | POA: Insufficient documentation

## 2021-09-24 DIAGNOSIS — Z79899 Other long term (current) drug therapy: Secondary | ICD-10-CM | POA: Insufficient documentation

## 2021-09-24 DIAGNOSIS — Z7984 Long term (current) use of oral hypoglycemic drugs: Secondary | ICD-10-CM | POA: Insufficient documentation

## 2021-09-24 DIAGNOSIS — Z8616 Personal history of COVID-19: Secondary | ICD-10-CM | POA: Insufficient documentation

## 2021-09-24 DIAGNOSIS — M25572 Pain in left ankle and joints of left foot: Secondary | ICD-10-CM

## 2021-09-24 MED ORDER — IBUPROFEN 800 MG PO TABS
800.0000 mg | ORAL_TABLET | Freq: Once | ORAL | Status: AC
Start: 2021-09-24 — End: 2021-09-24
  Administered 2021-09-24: 800 mg via ORAL
  Filled 2021-09-24: qty 1

## 2021-09-24 MED ORDER — METOPROLOL TARTRATE 25 MG PO TABS
25.0000 mg | ORAL_TABLET | Freq: Once | ORAL | Status: AC
  Administered 2021-09-24: 25 mg via ORAL
  Filled 2021-09-24: qty 1

## 2021-09-24 MED ORDER — IBUPROFEN 800 MG PO TABS
800.0000 mg | ORAL_TABLET | Freq: Three times a day (TID) | ORAL | 0 refills | Status: AC
  Filled 2021-09-24: qty 21, 7d supply, fill #0

## 2021-09-24 MED ORDER — CALCIUM CARBONATE ANTACID 500 MG PO CHEW
1.0000 | CHEWABLE_TABLET | Freq: Once | ORAL | Status: AC
  Administered 2021-09-24: 200 mg via ORAL
  Filled 2021-09-24: qty 1

## 2021-09-24 MED ORDER — AMLODIPINE BESYLATE 5 MG PO TABS
10.0000 mg | ORAL_TABLET | Freq: Once | ORAL | Status: AC
  Administered 2021-09-24: 10 mg via ORAL
  Filled 2021-09-24: qty 2

## 2021-09-24 NOTE — ED Triage Notes (Signed)
Last night at movie stepped backwards on step.  States has ankle pain.  Unable to weight bear

## 2021-09-24 NOTE — ED Provider Notes (Signed)
[facility] EMERGENCY DEPT Provider Note   CSN: [id number] Arrival date & time: 09/24/21  7078     History Chief Complaint  Patient presents with   Ankle Pain    Left     Shawna Harris is a 45 y.o. female.  HPI 45 year old female presents with left ankle injury.  Last night while going to the movies she stepped backwards and did not realize that it was not even footing and twisted her ankle.  She also fell to the ground.  She did hit her head but she has no headache.  Since then her left medial ankle has been hurting.  She was able to walk and go to the movie but then it was very sore and painful when she got out.  Took some ibuprofen around 1 AM.  This morning the pain seems worse and she is having a hard time walking on it.  She feels some tingling in her foot.  Pain is rated as severe.  Of note, patient is noted to be tachycardic and hypertensive and states she has not taken her blood pressure medicines this morning which include 10 mg amlodipine and 25 mg of metoprolol.  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   Stress at work 03/10/2021   Class 2 obesity due to excess calories with body mass index (BMI) of 35.0 to 35.9 in adult 03/10/2021   HTN (hypertension) 08/26/2020   DM2 (diabetes mellitus, type 2) (HCC) 08/26/2020   Acute respiratory disease due to COVID-19 virus 08/25/2020   [redacted] weeks gestation of pregnancy    Abdominal pain in pregnancy, antepartum    No prenatal care in current pregnancy     Past Surgical History:  Procedure Laterality Date   LEFT HEART CATH AND CORONARY ANGIOGRAPHY N/A 03/12/2021   Procedure: LEFT HEART CATH AND CORONARY ANGIOGRAPHY;  Surgeon: Lyn Records, MD;  Location:  [facility];  Service: Cardiovascular;  Laterality: N/A;   NO PAST SURGERIES       OB History     Gravida  5   Para  4   Term  4   Preterm      AB  1   Living  4      SAB      IAB  1   Ectopic      Multiple      Live Births              Family History  Problem Relation Age of Onset   Chronic Renal Failure Father    Heart failure Father    Stroke Neg Hx     Social History   Tobacco Use   Smoking status: Every Day    Packs/day: 1.00    Years: 28.00    Pack years: 28.00    Types: Cigarettes   Smokeless tobacco: Never  Substance Use Topics   Alcohol use: Yes    Comment: heavy weekend drinking   Drug use: No    Home Medications Prior to Admission medications   Medication Sig Start Date End Date Taking? Authorizing Provider  albuterol (VENTOLIN HFA) 108 (90 Base) MCG/ACT inhaler Inhale 2 puffs into the lungs  every 4 (four) hours as needed for wheezing or shortness of breath. Patient not taking: No sig reported 08/26/20   [place], Alvira Philips, DO  Ascorbic Acid (VITAMIN C) 1000 MG tablet Take 1,000 mg by mouth daily.    [provider]  aspirin 81 MG chewable tablet Chew 1 tablet (81 mg total) by mouth daily. 03/13/21   Leatha Gilding, MD  atorvastatin (LIPITOR) 40 MG tablet Take 1 tablet (40 mg total) by mouth daily. 03/13/21   Leatha Gilding, MD  Cholecalciferol (VITAMIN D-3) 125 MCG (5000 UT) TABS Take 5,000 Units by mouth daily.    [provider]  Continuous Blood Gluc Sensor (FREESTYLE LIBRE SENSOR SYSTEM) MISC 1 each by Does not apply route daily. 03/12/21   Leatha Gilding, MD  fenofibrate 160 MG tablet Take 1 tablet (160 mg total) by mouth daily. 03/13/21   Gherghe, Daylene Katayama, MD  guaiFENesin-dextromethorphan (ROBITUSSIN DM) 100-10 MG/5ML syrup Take 10 mLs by mouth every 4 (four) hours as needed for cough. Patient not taking: No sig reported 08/26/20   [place], Alvira Philips, DO  insulin glargine (LANTUS) 100 UNIT/ML injection  Inject 0.2 mLs (20 Units total) into the skin at bedtime. 03/12/21   Leatha Gilding, MD  insulin lispro (HUMALOG) 100 UNIT/ML injection CBG 70 - 120: 0 units CBG 121 - 150: 2 units CBG 151 - 200: 3 units CBG 201 - 250: 5 units CBG 251 - 300: 8 units CBG 301 - 350: 10 units CBG 351 - 400: 12 units 03/12/21   Gherghe, Daylene Katayama, MD  Insulin Syringe-Needle U-100 (BD INSULIN SYRINGE U/F) 30G X 1/2" 0.5 ML MISC 1 each by Does not apply route 4 (four) times daily. 03/12/21   Leatha Gilding, MD  metFORMIN (GLUCOPHAGE) 500 MG tablet Take 500 mg by mouth 2 (two) times daily. 07/06/20   [provider]  metoprolol succinate (TOPROL XL) 25 MG 24 hr tablet Take 1 tablet (25 mg total) by mouth daily. 03/12/21 03/12/22  Leatha Gilding, MD  Multiple Vitamin (MULTIVITAMIN) capsule Take 1 capsule by mouth every morning.    [provider]  zinc gluconate 50 MG tablet Take 50 mg by mouth daily.    [provider]    Allergies    Patient has no known allergies.  Review of Systems   Review of Systems  Musculoskeletal:  Positive for arthralgias and joint swelling.   Physical Exam Updated Vital Signs BP (!) 183/122 (BP Location: Right Arm)   Pulse (!) 114   Temp 98.1 F (36.7 C) (Oral)   Resp 18   Ht 5\' 4"  (1.626 m)   Wt 93.9 kg   LMP 09/03/2021 (Approximate)   SpO2 98%   BMI 35.53 kg/m   Physical Exam Vitals and nursing note reviewed.  Constitutional:      Appearance: She is well-developed.  HENT:     Head: Normocephalic and atraumatic.  Eyes:     General:        Right eye: No discharge.        Left eye: No discharge.  Cardiovascular:     Rate and Rhythm: Regular rhythm. Tachycardia present.     Pulses:          Dorsalis pedis pulses are 2+ on the left side.  Pulmonary:     Effort: Pulmonary effort is normal.  Abdominal:     General: There is no distension.  Musculoskeletal:     Left knee: No tenderness.  Left lower leg: No swelling or tenderness.      Left ankle: Swelling present. Tenderness present over the medial malleolus. Normal range of motion.     Left Achilles Tendon: No tenderness or defects.     Left foot: No tenderness.     Comments: Grossly normal sensation in the left foot Predominantly medial swelling to left ankle/proximal foot  Skin:    General: Skin is warm and dry.  Neurological:     Mental Status: She is alert.  Psychiatric:        Mood and Affect: Mood is not anxious.    ED Results / Procedures / Treatments   Labs (all labs ordered are listed, but only abnormal results are displayed) Labs Reviewed - No data to display  EKG None  Radiology DG Ankle Complete Left  Result Date: 09/24/2021 CLINICAL DATA:  Acute left ankle pain after fall last night. EXAM: LEFT ANKLE COMPLETE - 3+ VIEW COMPARISON:  None. FINDINGS: There is no evidence of fracture, dislocation, or joint effusion. There is no evidence of arthropathy or other focal bone abnormality. Soft tissues are unremarkable. IMPRESSION: Negative. Electronically Signed   By: Lupita Raider M.D.   On: 09/24/2021 10:09    Procedures Procedures   Medications Ordered in ED Medications  ibuprofen (ADVIL) tablet 800 mg (has no administration in time range)  amLODipine (NORVASC) tablet 10 mg (has no administration in time range)  metoprolol tartrate (LOPRESSOR) tablet 25 mg (has no administration in time range)    ED Course  I have reviewed the triage vital signs and the nursing notes.  Pertinent labs & imaging results that were available during my care of the patient were reviewed by me and considered in my medical decision making (see chart for details).    MDM Rules/Calculators/A&P                           Patient appears to have an ankle sprain. NV intact. Discussed options, and given such difficulty walking she wants to get a boot. Will advised RICE.  She is also hypertensive and mildly tachycardic here, part of this is probably pain before this is  also not taking her meds this morning.  Morning meds were given and she needs to follow-up with her PCP about the hypertension. Final Clinical Impression(s) / ED Diagnoses Final diagnoses:  Sprain of left ankle, unspecified ligament, initial encounter    Rx / DC Orders ED Discharge Orders     None        Pricilla Loveless, MD 09/24/21 1032

## 2022-04-02 IMAGING — DX DG ANKLE COMPLETE 3+V*L*
1 series · 3 of 3 positions shown · non-contrast
Comparison: None.

CLINICAL DATA: Acute left ankle pain after fall last night.

EXAM:
LEFT ANKLE COMPLETE - 3+ VIEW

[Series 1: ankle · 0.14mm/px · 3 of 3 slices shown]
[im 1/3]
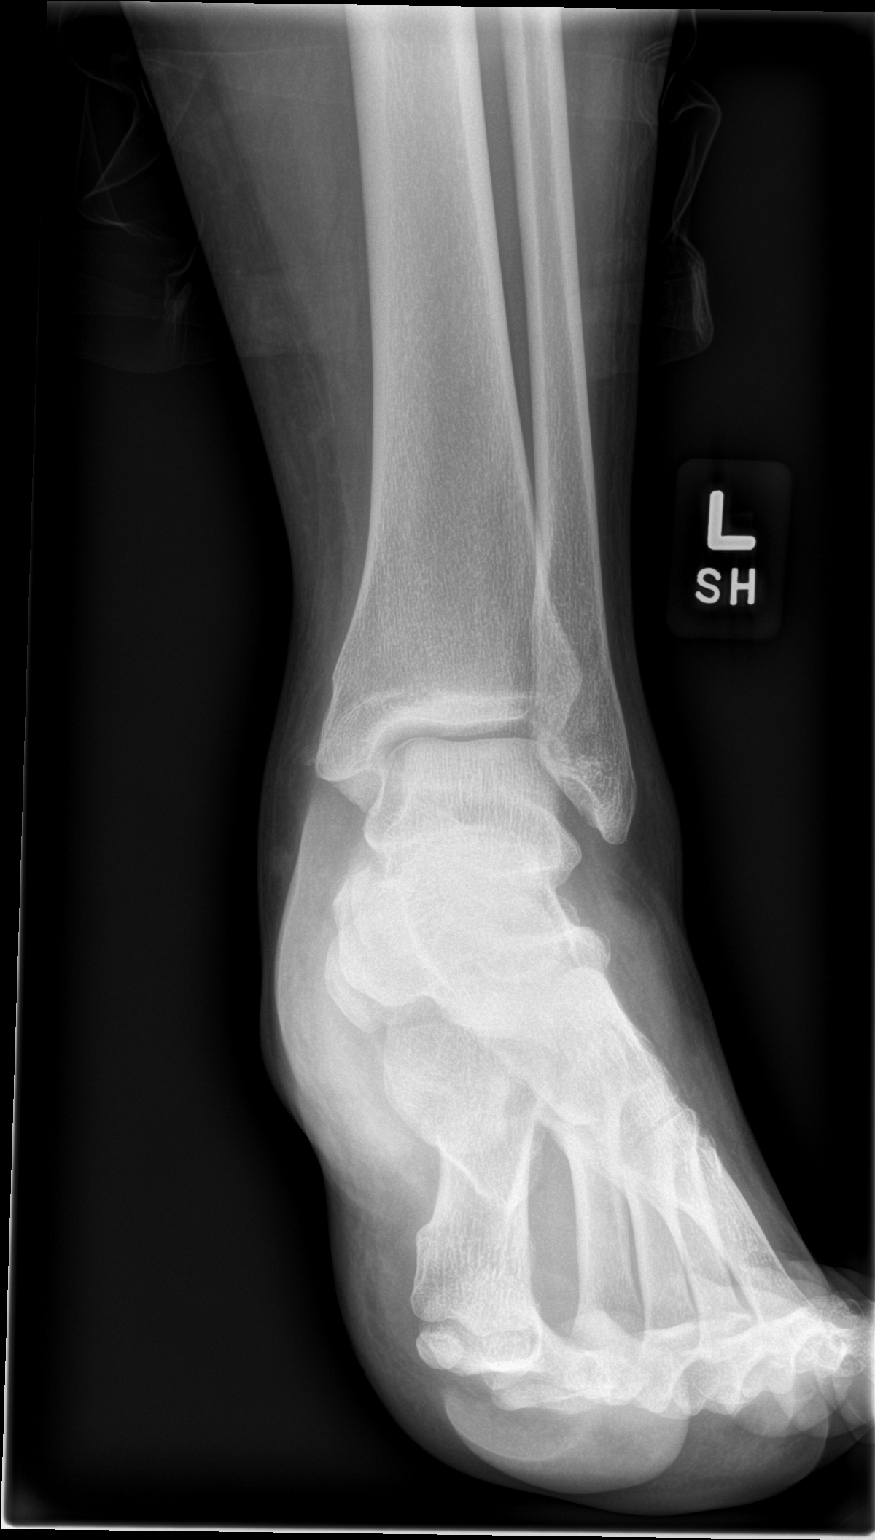
[im 2/3]
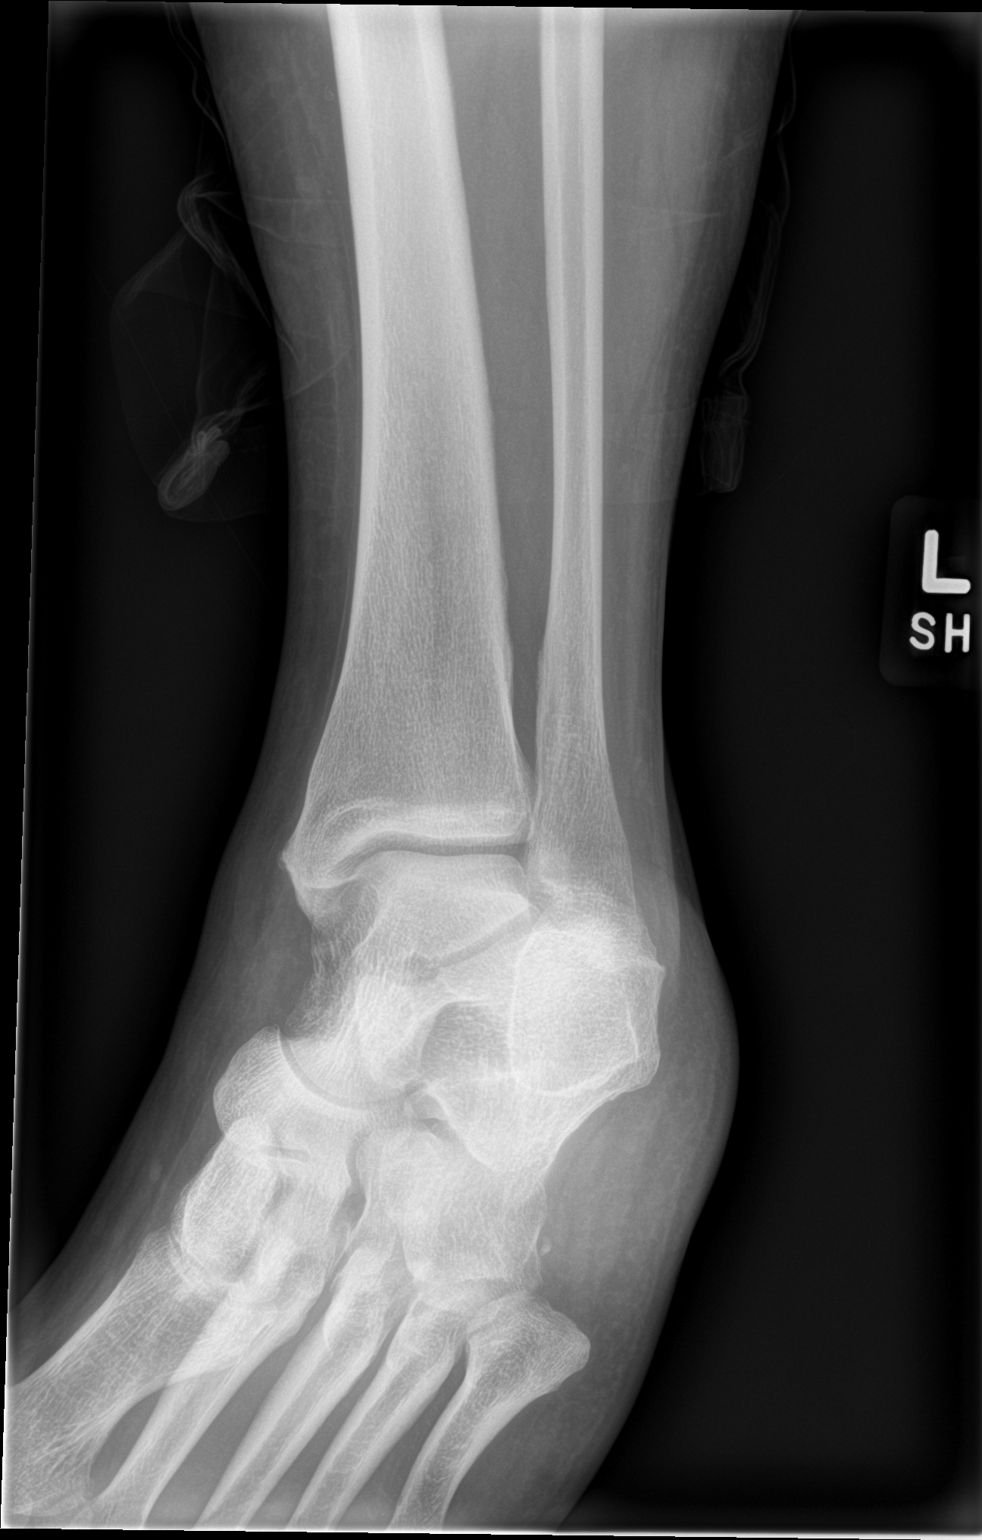
[im 3/3]
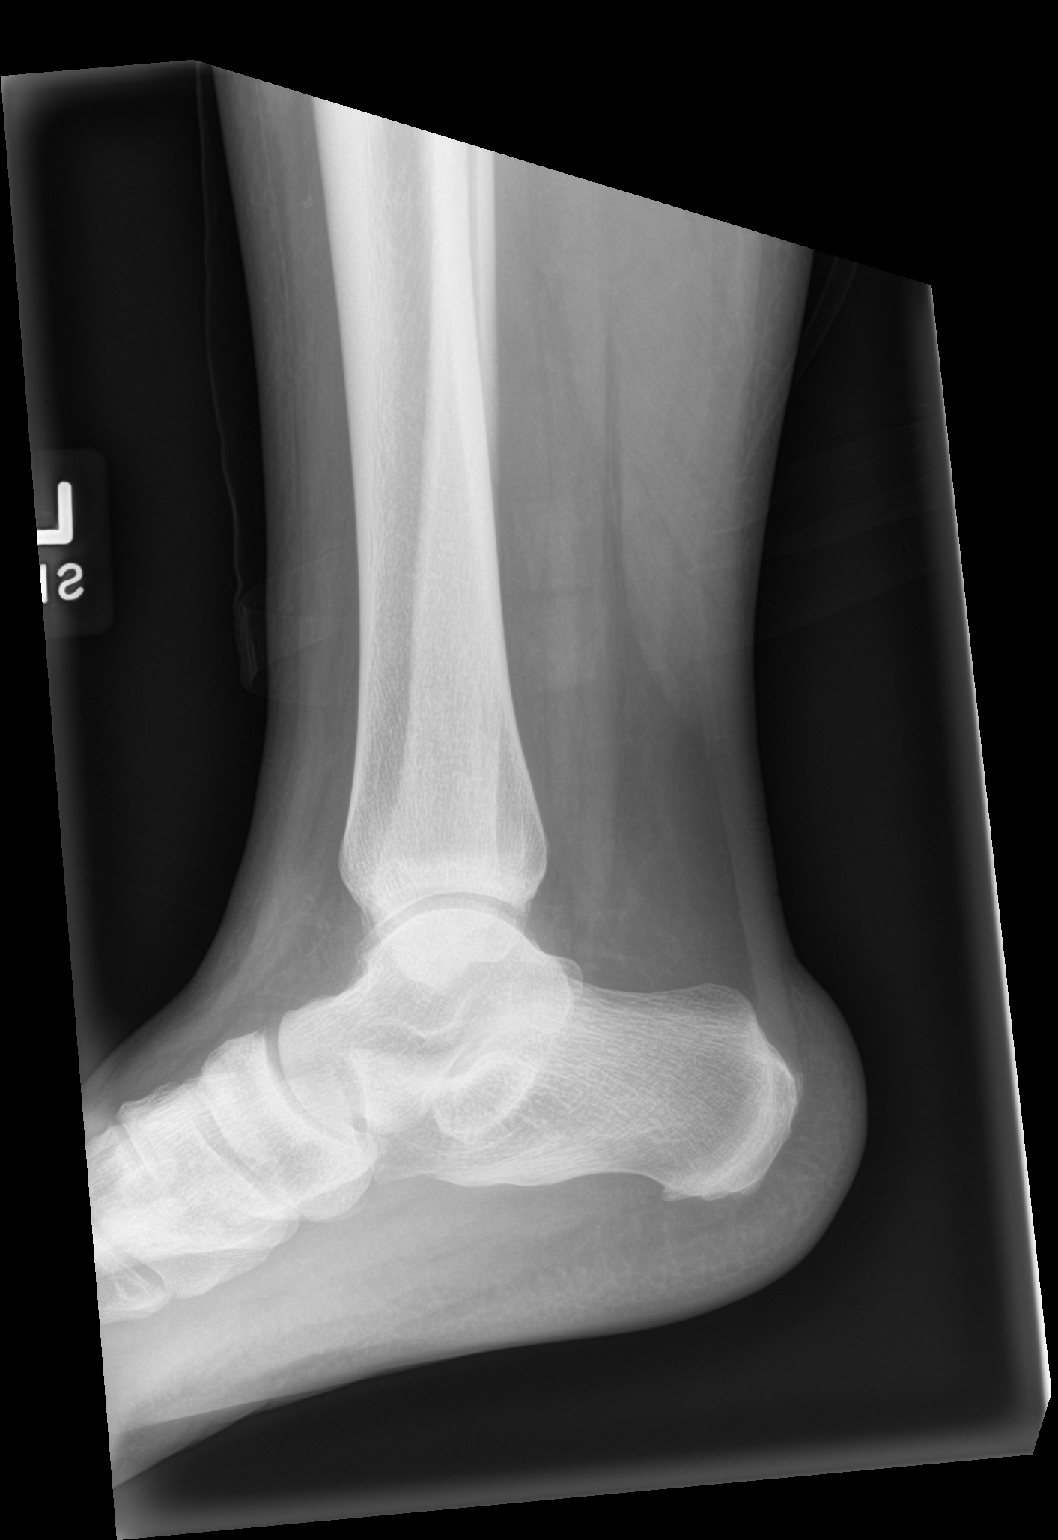

[3 of 3 positions shown; findings below may reference images not displayed]

FINDINGS: There is no evidence of fracture, dislocation, or joint effusion.
There is no evidence of arthropathy or other focal bone abnormality.
Soft tissues are unremarkable.
IMPRESSION: Negative.

## 2022-04-04 ENCOUNTER — Other Ambulatory Visit: Payer: Self-pay | Admitting: Internal Medicine

## 2022-04-04 DIAGNOSIS — Z1231 Encounter for screening mammogram for malignant neoplasm of breast: Secondary | ICD-10-CM

## 2022-04-20 ENCOUNTER — Ambulatory Visit (INDEPENDENT_AMBULATORY_CARE_PROVIDER_SITE_OTHER): Payer: 59

## 2022-04-20 DIAGNOSIS — Z1231 Encounter for screening mammogram for malignant neoplasm of breast: Secondary | ICD-10-CM | POA: Diagnosis not present

## 2023-01-09 DIAGNOSIS — R87610 Atypical squamous cells of undetermined significance on cytologic smear of cervix (ASC-US): Secondary | ICD-10-CM | POA: Diagnosis not present

## 2023-01-09 DIAGNOSIS — Z124 Encounter for screening for malignant neoplasm of cervix: Secondary | ICD-10-CM | POA: Diagnosis not present

## 2023-02-06 DIAGNOSIS — B3731 Acute candidiasis of vulva and vagina: Secondary | ICD-10-CM | POA: Diagnosis not present

## 2023-02-06 DIAGNOSIS — Z6835 Body mass index (BMI) 35.0-35.9, adult: Secondary | ICD-10-CM | POA: Diagnosis not present

## 2023-03-06 DIAGNOSIS — B3731 Acute candidiasis of vulva and vagina: Secondary | ICD-10-CM | POA: Diagnosis not present

## 2023-06-17 DIAGNOSIS — T3695XA Adverse effect of unspecified systemic antibiotic, initial encounter: Secondary | ICD-10-CM | POA: Diagnosis not present

## 2023-06-17 DIAGNOSIS — B379 Candidiasis, unspecified: Secondary | ICD-10-CM | POA: Diagnosis not present

## 2023-06-17 DIAGNOSIS — Z6834 Body mass index (BMI) 34.0-34.9, adult: Secondary | ICD-10-CM | POA: Diagnosis not present

## 2023-06-17 DIAGNOSIS — K089 Disorder of teeth and supporting structures, unspecified: Secondary | ICD-10-CM | POA: Diagnosis not present

## 2023-07-06 DIAGNOSIS — E669 Obesity, unspecified: Secondary | ICD-10-CM | POA: Diagnosis not present

## 2023-07-06 DIAGNOSIS — D75839 Thrombocytosis, unspecified: Secondary | ICD-10-CM | POA: Diagnosis not present

## 2023-07-06 DIAGNOSIS — F17200 Nicotine dependence, unspecified, uncomplicated: Secondary | ICD-10-CM | POA: Diagnosis not present

## 2023-07-06 DIAGNOSIS — E782 Mixed hyperlipidemia: Secondary | ICD-10-CM | POA: Diagnosis not present

## 2023-07-06 DIAGNOSIS — E1169 Type 2 diabetes mellitus with other specified complication: Secondary | ICD-10-CM | POA: Diagnosis not present

## 2023-07-06 DIAGNOSIS — E1165 Type 2 diabetes mellitus with hyperglycemia: Secondary | ICD-10-CM | POA: Diagnosis not present

## 2023-07-06 DIAGNOSIS — Z1211 Encounter for screening for malignant neoplasm of colon: Secondary | ICD-10-CM | POA: Diagnosis not present

## 2023-07-06 DIAGNOSIS — Z0000 Encounter for general adult medical examination without abnormal findings: Secondary | ICD-10-CM | POA: Diagnosis not present

## 2023-07-06 DIAGNOSIS — I10 Essential (primary) hypertension: Secondary | ICD-10-CM | POA: Diagnosis not present

## 2024-04-23 ENCOUNTER — Telehealth: Payer: Self-pay | Admitting: Pharmacist

## 2024-04-23 NOTE — Progress Notes (Signed)
   04/23/2024  Patient ID: Shawna Harris, female   DOB: 1976-01-06, 48 y.o.   MRN: [id number]  Patient was identified for DM counseling based on A1c greater than 8% on last lab results.   Patient agreed to schedule for a free DM initial visit with me as the pharmacist. All changes are made in collaboration with the PCP. A phone visit was scheduled for 6/18 at 10AM. Patient's PCP located at [facility]. Patient advised to attend with information regarding diabetes medications and any potential barriers surrounding current treatment. If needing to re-schedule or questions prior to the visit, patient advised to contact me at [phone].     Delvin File, PharmD [facility]  Phone Number: [phone]

## 2024-05-01 ENCOUNTER — Telehealth: Payer: Self-pay | Admitting: Pharmacist

## 2024-05-01 NOTE — Progress Notes (Signed)
 05/01/2024  Patient ID: Shawna Harris, female   DOB: 22-Dec-1975, 48 y.o.   MRN: [id number]    05/01/2024 Name: Shawna Harris MRN: [id number] DOB: 1976-01-23  Chief Complaint  Patient presents with   Diabetes    Shawna Harris is a 48 y.o. year old female who presented for a telephone visit.   They were referred to the pharmacist by a quality report for assistance in managing diabetes.   TNM Diabetes patient   Subjective:  Care Team: Primary Care Provider: Tena Feeling, MD ; Next Scheduled Visit: 07/10/24 Clinical Pharmacist: Shawna Harris, PharmD  Medication Access/Adherence  Current Pharmacy:  [facility] [phone] Shawna Harris, [place] - [address] [place] [address] Phone: [phone] Fax: [phone]   Patient reports affordability concerns with their medications: Yes - no insurance Patient reports access/transportation concerns to their pharmacy: No  Patient reports adherence concerns with their medications:  No     Diabetes:  Current medications: None (should be taking Glimepiride 4mg  daily) Medications tried in the past: Unknown  Current glucose readings: Unknown Using OneTouch Verio meter; testing 0 times daily   Patient denies hypoglycemic s/sx including dizziness, shakiness, sweating. Patient reports hyperglycemic symptoms including polyuria, polydipsia, polyphagia, nocturia, neuropathy, blurred vision.  Current meal patterns:  - Lunch (2PM occasional): Unknown - Supper (6PM): Meat + veg, rice; hamburgers + gravy w/ onions + creamed potatoes + greens - Snacks: Fruit (watermelon, grapes, smoothies, mangos), celery/carrots + ranch - Late night snacks: Small Reese cups - Drinks: Sweet tea, water, 1 cup coffee ever 3 days, occasional juice Eats at home mostly- has a garden  Current physical activity: Neighborhood walk 3-4 days per week  Current medication access support: No insurance currently- plans to  get marketplace insurance prior to wellness visit in August again  202 lbs- some weight loss  07/06/23: A1c 11.7% eGFR 101   Objective:  Lab Results  Component Value Date   HGBA1C 11.2 (H) 03/10/2021    Lab Results  Component Value Date   CREATININE 0.52 03/12/2021   BUN 8 03/10/2021   NA 134 (L) 03/10/2021   K 3.4 (L) 03/10/2021   CL 103 03/10/2021   CO2 21 (L) 03/10/2021    Lab Results  Component Value Date   CHOL 274 (H) 03/10/2021   HDL NOT REPORTED DUE TO HIGH TRIGLYCERIDES 03/10/2021   LDLCALC NOT CALCULATED 03/10/2021   LDLDIRECT 36.6 03/10/2021   TRIG 1,553 (H) 03/10/2021   CHOLHDL NOT REPORTED DUE TO HIGH TRIGLYCERIDES 03/10/2021    Medications Reviewed Today   Medications were not reviewed in this encounter       Assessment/Plan:   Diabetes: - Currently uncontrolled - Reviewed long term cardiovascular and renal outcomes of uncontrolled blood sugar - Reviewed goal A1c, goal fasting, and goal 2 hour post prandial glucose - Recommend to focus on dietary changes prior to upcoming visit - Recommend to check glucose daily   **Summary for PCP:** - Set task to follow-up with patient after PCP visit- call in September - Patient did NOT have any follow-ups due to lack of insurance - Plans to get a [employer] plan when it gets closer to her doctor's visit again- advised she needs to call the office and schedule regular visit for DM as well - Will ask Dr. Candi Harris if patient can have refills of Valsartan and Glimepiride up until 06/2023 visit and if no new A1c checked, will stop sending medications  Shawna Harris, PharmD [facility]  Phone Number: [phone]
